# Patient Record
Sex: Male | Born: 1955
Health system: Southern US, Community
[De-identification: ages and names within clinical notes are randomized; demographics above are authoritative.]

## PROBLEM LIST (undated history)

## (undated) DIAGNOSIS — M1990 Unspecified osteoarthritis, unspecified site: Secondary | ICD-10-CM

## (undated) DIAGNOSIS — R7989 Other specified abnormal findings of blood chemistry: Secondary | ICD-10-CM

## (undated) DIAGNOSIS — R7303 Prediabetes: Secondary | ICD-10-CM

## (undated) DIAGNOSIS — G4733 Obstructive sleep apnea (adult) (pediatric): Secondary | ICD-10-CM

## (undated) DIAGNOSIS — G5600 Carpal tunnel syndrome, unspecified upper limb: Secondary | ICD-10-CM

## (undated) DIAGNOSIS — I209 Angina pectoris, unspecified: Secondary | ICD-10-CM

## (undated) DIAGNOSIS — B351 Tinea unguium: Secondary | ICD-10-CM

## (undated) DIAGNOSIS — G4730 Sleep apnea, unspecified: Secondary | ICD-10-CM

## (undated) HISTORY — DX: Prediabetes: R73.03

## (undated) HISTORY — DX: Carpal tunnel syndrome, unspecified upper limb: G56.00

## (undated) HISTORY — PX: ROTATOR CUFF REPAIR: SHX139

## (undated) HISTORY — DX: Obstructive sleep apnea (adult) (pediatric): G47.33

## (undated) HISTORY — PX: OTHER SURGICAL HISTORY: SHX169

## (undated) HISTORY — DX: Other specified abnormal findings of blood chemistry: R79.89

## (undated) HISTORY — PX: NASAL SEPTUM SURGERY: SHX37

## (undated) HISTORY — DX: Tinea unguium: B35.1

---

## 2000-01-30 ENCOUNTER — Encounter: Payer: Self-pay | Admitting: Emergency Medicine

## 2000-01-30 ENCOUNTER — Emergency Department (HOSPITAL_COMMUNITY): Admission: EM | Admit: 2000-01-30 | Discharge: 2000-01-30 | Payer: Self-pay | Admitting: Emergency Medicine

## 2011-01-17 ENCOUNTER — Encounter: Payer: Self-pay | Admitting: Orthopaedic Surgery

## 2015-02-01 ENCOUNTER — Ambulatory Visit: Payer: Self-pay | Admitting: Orthopedic Surgery

## 2015-02-01 NOTE — Progress Notes (Signed)
Preoperative surgical orders have been place into the Epic hospital system for Bill [name] on 02/01/2015, 10:51 AM  by Patrica DuelPERKINS, Constancia Geeting for surgery on 02-14-2015.  Preop Total Hip - Anterior Approach orders including Experel Injecion, IV Tylenol, and IV Decadron as long as there are no contraindications to the above medications. [name], PA-C

## 2015-02-08 ENCOUNTER — Encounter (HOSPITAL_COMMUNITY)
Admission: RE | Admit: 2015-02-08 | Discharge: 2015-02-08 | Disposition: A | Discharge status: Home / Self Care | Payer: BLUE CROSS/BLUE SHIELD | Source: Ambulatory Visit | Attending: Orthopedic Surgery | Admitting: Orthopedic Surgery

## 2015-02-08 ENCOUNTER — Encounter (HOSPITAL_COMMUNITY): Payer: Self-pay

## 2015-02-08 DIAGNOSIS — Z01818 Encounter for other preprocedural examination: Secondary | ICD-10-CM | POA: Insufficient documentation

## 2015-02-08 DIAGNOSIS — M1611 Unilateral primary osteoarthritis, right hip: Secondary | ICD-10-CM | POA: Insufficient documentation

## 2015-02-08 HISTORY — DX: Sleep apnea, unspecified: G47.30

## 2015-02-08 HISTORY — DX: Angina pectoris, unspecified: I20.9

## 2015-02-08 HISTORY — DX: Unspecified osteoarthritis, unspecified site: M19.90

## 2015-02-08 LAB — COMPREHENSIVE METABOLIC PANEL
ALBUMIN: 4.6 g/dL (ref 3.5–5.2)
ALK PHOS: 67 U/L (ref 39–117)
ALT: 15 U/L (ref 0–53)
AST: 23 U/L (ref 0–37)
Anion gap: 8 (ref 5–15)
BUN: 20 mg/dL (ref 6–23)
CO2: 30 mmol/L (ref 19–32)
Calcium: 9.7 mg/dL (ref 8.4–10.5)
Chloride: 98 mmol/L (ref 96–112)
Creatinine, Ser: 0.73 mg/dL (ref 0.50–1.35)
GFR calc Af Amer: >90 mL/min (ref >90)
GFR calc non Af Amer: >90 mL/min (ref >90)
Glucose, Bld: 88 mg/dL (ref 70–99)
POTASSIUM: 3.9 mmol/L (ref 3.5–5.1)
SODIUM: 136 mmol/L (ref 135–145)
Total Bilirubin: 0.9 mg/dL (ref 0.3–1.2)
Total Protein: 7.8 g/dL (ref 6.0–8.3)

## 2015-02-08 LAB — SURGICAL PCR SCREEN
MRSA, PCR: NEGATIVE
Staphylococcus aureus: NEGATIVE

## 2015-02-08 LAB — URINALYSIS, ROUTINE W REFLEX MICROSCOPIC
BILIRUBIN URINE: NEGATIVE
Glucose, UA: NEGATIVE mg/dL
HGB URINE DIPSTICK: NEGATIVE
Ketones, ur: NEGATIVE mg/dL
Leukocytes, UA: NEGATIVE
Nitrite: NEGATIVE
PH: 6 (ref 5.0–8.0)
PROTEIN: NEGATIVE mg/dL
Specific Gravity, Urine: 1.016 (ref 1.005–1.030)
Urobilinogen, UA: 0.2 mg/dL (ref 0.0–1.0)

## 2015-02-08 LAB — CBC
HEMATOCRIT: 50.4 % (ref 39.0–52.0)
HEMOGLOBIN: 17.1 g/dL — AB (ref 13.0–17.0)
MCH: 31.5 pg (ref 26.0–34.0)
MCHC: 33.9 g/dL (ref 30.0–36.0)
MCV: 92.8 fL (ref 78.0–100.0)
Platelets: 304 10*3/uL (ref 150–400)
RBC: 5.43 MIL/uL (ref 4.22–5.81)
RDW: 13.1 % (ref 11.5–15.5)
WBC: 7.5 10*3/uL (ref 4.0–10.5)

## 2015-02-08 LAB — PROTIME-INR
INR: 0.96 (ref 0.00–1.49)
Prothrombin Time: 12.8 seconds (ref 11.6–15.2)

## 2015-02-08 LAB — APTT: APTT: 33 s (ref 24–37)

## 2015-02-08 NOTE — Patient Instructions (Addendum)
20 Brennan BaileyJerry W Sandell  02/08/2015   Your procedure is scheduled on:      Thursday February 18,2016   [facility] and follow signs to  Short Stay Center arrive at 11:00 AM.   Call this number if you have problems the morning of surgery [phone] or Presurgical Testing [phone].   Remember:  Do not eat food After Midnight but may take clear liquids till 7:00 am day of surgery then nothing by mouth.   For Living Will and/or Health Care Power Attorney Forms: please provide copy for your medical record, may bring AM of surgery (forms should be already notarized-we do not provide this service).    For Cpap use: bring mask and tubing only.     Take these medicines the morning of surgery with A SIP OF WATER: NONE                               You may not have any metal on your body including hair pins and piercings  Do not wear jewelry,cologne, lotions, powders, or deodorant.  Men may shave face and neck.               Do not bring valuables to the hospital. [facility] IS NOT RESPONSIBLE FOR VALUABLES.  Contacts, dentures or bridgework may not be worn into surgery.  Leave suitcase in the car. After surgery it may be brought to your room.  For patients admitted to the hospital, checkout time is 11:00 AM the day of discharge.    Special Instructions: review fact sheets for MRSA information, Blood Transfusion fact sheet, Incentive Spirometry.  ________________________________________________________________________  [facility] Before surgery, you can play an important role.  Because skin is not sterile, your skin needs to be as free of germs as possible.  You can reduce the number of germs on your skin by washing with CHG (chlorahexidine gluconate) soap before surgery.  CHG is an antiseptic cleaner which kills germs and bonds with the skin to continue killing germs even after washing. Please DO NOT use if you have an allergy to CHG or  antibacterial soaps.  If your skin becomes reddened/irritated stop using the CHG and inform your nurse when you arrive at Short Stay. Do not shave (including legs and underarms) for at least 48 hours prior to the first CHG shower.  You may shave your face/neck. Please follow these instructions carefully:  1.  Shower with CHG Soap the night before surgery and the  morning of Surgery.  2.  If you choose to wash your hair, wash your hair first as usual with your  normal  shampoo.  3.  After you shampoo, rinse your hair and body thoroughly to remove the  shampoo.                           4.  Use CHG as you would any other liquid soap.  You can apply chg directly  to the skin and wash                       Gently with a scrungie or clean washcloth.  5.  Apply the CHG Soap to your body ONLY FROM THE NECK DOWN.   Do not use on face/ open  Wound or open sores. Avoid contact with eyes, ears mouth and genitals (private parts).                       Wash face,  Genitals (private parts) with your normal soap.             6.  Wash thoroughly, paying special attention to the area where your surgery  will be performed.  7.  Thoroughly rinse your body with warm water from the neck down.  8.  DO NOT shower/wash with your normal soap after using and rinsing off  the CHG Soap.                9.  Pat yourself dry with a clean towel.            10.  Wear clean pajamas.            11.  Place clean sheets on your bed the night of your first shower and do not  sleep with pets. Day of Surgery : Do not apply any lotions/deodorants the morning of surgery.  Please wear clean clothes to the hospital/surgery center.  FAILURE TO FOLLOW THESE INSTRUCTIONS MAY RESULT IN THE CANCELLATION OF YOUR SURGERY PATIENT SIGNATURE_________________________________  NURSE SIGNATURE__________________________________  ________________________________________________________________________    CLEAR LIQUID  DIET   Foods Allowed                                                                     Foods Excluded  Coffee and tea, regular and decaf                             liquids that you cannot  Plain Jell-O in any flavor                                             see through such as: Fruit ices (not with fruit pulp)                                     milk, soups, orange juice  Iced Popsicles                                    All solid food Carbonated beverages, regular and diet                                    Cranberry, grape and apple juices Sports drinks like Gatorade Lightly seasoned clear broth or consume(fat free) Sugar, honey syrup  Sample Menu Breakfast                                Lunch  Supper Cranberry juice                    Beef broth                            Chicken broth Jell-O                                     Grape juice                           Apple juice Coffee or tea                        Jell-O                                      Popsicle                                                Coffee or tea                        Coffee or tea  _____________________________________________________________________    Incentive Spirometer  An incentive spirometer is a tool that can help keep your lungs clear and active. This tool measures how well you are filling your lungs with each breath. Taking long deep breaths may help reverse or decrease the chance of developing breathing (pulmonary) problems (especially infection) following:  A long period of time when you are unable to move or be active. BEFORE THE PROCEDURE   If the spirometer includes an indicator to show your best effort, your nurse or respiratory therapist will set it to a desired goal.  If possible, sit up straight or lean slightly forward. Try not to slouch.  Hold the incentive spirometer in an upright position. INSTRUCTIONS FOR USE   Sit on the edge of  your bed if possible, or sit up as far as you can in bed or on a chair.  Hold the incentive spirometer in an upright position.  Breathe out normally.  Place the mouthpiece in your mouth and seal your lips tightly around it.  Breathe in slowly and as deeply as possible, raising the piston or the ball toward the top of the column.  Hold your breath for 3-5 seconds or for as long as possible. Allow the piston or ball to fall to the bottom of the column.  Remove the mouthpiece from your mouth and breathe out normally.  Rest for a few seconds and repeat Steps 1 through 7 at least 10 times every 1-2 hours when you are awake. Take your time and take a few normal breaths between deep breaths.  The spirometer may include an indicator to show your best effort. Use the indicator as a goal to work toward during each repetition.  After each set of 10 deep breaths, practice coughing to be sure your lungs are clear. If you have an incision (the cut made at the time of surgery), support your incision when coughing by placing a pillow or rolled up towels firmly against  it. Once you are able to get out of bed, walk around indoors and cough well. You may stop using the incentive spirometer when instructed by your caregiver.  RISKS AND COMPLICATIONS  Take your time so you do not get dizzy or light-headed.  If you are in pain, you may need to take or ask for pain medication before doing incentive spirometry. It is harder to take a deep breath if you are having pain. AFTER USE  Rest and breathe slowly and easily.  It can be helpful to keep track of a log of your progress. Your caregiver can provide you with a simple table to help with this. If you are using the spirometer at home, follow these instructions: [place] IF:   You are having difficultly using the spirometer.  You have trouble using the spirometer as often as instructed.  Your pain medication is not giving enough relief while using  the spirometer.  You develop fever of 100.5 F (38.1 C) or higher. SEEK IMMEDIATE MEDICAL CARE IF:   You cough up bloody sputum that had not been present before.  You develop fever of 102 F (38.9 C) or greater.  You develop worsening pain at or near the incision site. MAKE SURE YOU:   Understand these instructions.  Will watch your condition.  Will get help right away if you are not doing well or get worse. Document Released: 04/26/2007 Document Revised: 03/07/2012 Document Reviewed: 06/27/2007 Exit[employer] Patient Information 2014 Exit[employer], [place].   ________________________________________________________________________  WHAT IS A BLOOD TRANSFUSION? Blood Transfusion Information  A transfusion is the replacement of blood or some of its parts. Blood is made up of multiple cells which provide different functions.  Red blood cells carry oxygen and are used for blood loss replacement.  White blood cells fight against infection.  Platelets control bleeding.  Plasma helps clot blood.  Other blood products are available for specialized needs, such as hemophilia or other clotting disorders. BEFORE THE TRANSFUSION  Who gives blood for transfusions?   Healthy volunteers who are fully evaluated to make sure their blood is safe. This is blood bank blood. Transfusion therapy is the safest it has ever been in the practice of medicine. Before blood is taken from a donor, a complete history is taken to make sure that person has no history of diseases nor engages in risky social behavior (examples are intravenous drug use or sexual activity with multiple partners). The donor's travel history is screened to minimize risk of transmitting infections, such as malaria. The donated blood is tested for signs of infectious diseases, such as HIV and hepatitis. The blood is then tested to be sure it is compatible with you in order to minimize the chance of a transfusion reaction. If you or a relative  donates blood, this is often done in anticipation of surgery and is not appropriate for emergency situations. It takes many days to process the donated blood. RISKS AND COMPLICATIONS Although transfusion therapy is very safe and saves many lives, the main dangers of transfusion include:   Getting an infectious disease.  Developing a transfusion reaction. This is an allergic reaction to something in the blood you were given. Every precaution is taken to prevent this. The decision to have a blood transfusion has been considered carefully by your caregiver before blood is given. Blood is not given unless the benefits outweigh the risks. AFTER THE TRANSFUSION  Right after receiving a blood transfusion, you will usually feel much better and more  energetic. This is especially true if your red blood cells have gotten low (anemic). The transfusion raises the level of the red blood cells which carry oxygen, and this usually causes an energy increase.  The nurse administering the transfusion will monitor you carefully for complications. HOME CARE INSTRUCTIONS  No special instructions are needed after a transfusion. You may find your energy is better. Speak with your caregiver about any limitations on activity for underlying diseases you may have. SEEK MEDICAL CARE IF:   Your condition is not improving after your transfusion.  You develop redness or irritation at the intravenous (IV) site. SEEK IMMEDIATE MEDICAL CARE IF:  Any of the following symptoms occur over the next 12 hours:  Shaking chills.  You have a temperature by mouth above 102 F (38.9 C), not controlled by medicine.  Chest, back, or muscle pain.  People around you feel you are not acting correctly or are confused.  Shortness of breath or difficulty breathing.  Dizziness and fainting.  You get a rash or develop hives.  You have a decrease in urine output.  Your urine turns a dark color or changes to pink, red, or brown. Any  of the following symptoms occur over the next 10 days:  You have a temperature by mouth above 102 F (38.9 C), not controlled by medicine.  Shortness of breath.  Weakness after normal activity.  The white part of the eye turns yellow (jaundice).  You have a decrease in the amount of urine or are urinating less often.  Your urine turns a dark color or changes to pink, red, or brown. Document Released: 12/11/2000 Document Revised: 03/07/2012 Document Reviewed: 07/30/2008 [facility] Patient Information 2014 [place], [place].  _______________________________________________________________________

## 2015-02-08 NOTE — Progress Notes (Signed)
Clearance note per chart per Dr Azucena CecilSwayne 12/06/2014

## 2015-02-12 NOTE — H&P (Signed)
TOTAL HIP ADMISSION H&P  Patient is admitted for right total hip arthroplasty.  Subjective:  Chief Complaint: right hip pain  HPI: Bill [name], 59 y.o. male, has a history of pain and functional disability in the right hip(s) due to arthritis and patient has failed non-surgical conservative treatments for greater than 12 weeks to include NSAID's and/or analgesics, corticosteriod injections and activity modification.  Onset of symptoms was gradual starting 2 years ago with gradually worsening course since that time.The patient noted no past surgery on the right hip(s).  Patient currently rates pain in the right hip at 8 out of 10 with activity. Patient has night pain, worsening of pain with activity and weight bearing, pain that interfers with activities of daily living, pain with passive range of motion and crepitus. Patient has evidence of periarticular osteophytes and joint space narrowing by imaging studies. This condition presents safety issues increasing the risk of falls.  There is no current active infection.   Past Medical History  Diagnosis Date  . Sleep apnea     uses CPAP  . Arthritis   . Anginal pain     hx of noted ER visit in 2001 pt had CXR preformed 01/30/2000 discussed CP issues pt states never had to followup with cardiologist denies further issues     Past Surgical History  Procedure Laterality Date  . Broken left foot       1988 secondary to fall had to have surgical intervention has hardware  . Nasal septum surgery    . Colonscopy         Current outpatient prescriptions:  .  naproxen sodium (ANAPROX) 220 MG tablet, Take 220 mg by mouth 2 (two) times daily as needed (for pain)., Disp: , Rfl:  .  testosterone cypionate (DEPOTESTOTERONE CYPIONATE) 100 MG/ML injection, Inject 100 mg into the muscle every 7 (seven) days. For IM use only, Disp: , Rfl:   No Known Allergies  History  Substance Use Topics  . Smoking status: Never Smoker   . Smokeless tobacco: Never Used   . Alcohol Use: No    Review of Systems  Constitutional: Negative.   HENT: Positive for tinnitus. Negative for congestion, ear discharge, ear pain, hearing loss, nosebleeds and sore throat.   Eyes: Negative.   Respiratory: Negative.  Negative for stridor.   Cardiovascular: Negative.   Gastrointestinal: Negative.   Genitourinary: Negative.   Musculoskeletal: Positive for back pain and joint pain. Negative for myalgias, falls and neck pain.       Right hip pain  Skin: Negative.   Neurological: Negative.  Negative for headaches.  Endo/Heme/Allergies: Negative.   Psychiatric/Behavioral: Negative.     Objective:  Physical Exam  Constitutional: He is oriented to person, place, and time. He appears well-developed and well-nourished. No distress.  HENT:  Head: Normocephalic and atraumatic.  Right Ear: External ear normal.  Left Ear: External ear normal.  Nose: Nose normal.  Mouth/Throat: Oropharynx is clear and moist.  Eyes: Conjunctivae and EOM are normal.  Neck: Normal range of motion. Neck supple.  Cardiovascular: Normal rate, regular rhythm, normal heart sounds and intact distal pulses.   No murmur heard. Respiratory: Effort normal and breath sounds normal. No respiratory distress. He has no wheezes.  GI: Soft. Bowel sounds are normal. He exhibits no distension. There is no tenderness.  Musculoskeletal:       Right hip: He exhibits decreased range of motion and crepitus.       Left hip: He exhibits decreased  range of motion and crepitus.       Right knee: He exhibits decreased range of motion. He exhibits no swelling, no effusion and no erythema. Tenderness found. Medial joint line and lateral joint line tenderness noted.       Left knee: Normal.       Right lower leg: He exhibits no tenderness and no swelling.       Left lower leg: He exhibits no tenderness and no swelling.  The right hip flexes to about 110, rotation in 20, out 30 and abduction 30 with slight discomfort. The  left hip has the same range of motion, also with slight discomfort. No trochanteric tenderness on either side. Right knee shows a slight valgus. Range is about 5-120. Moderate crepitus on range of motion. Tenderness lateral greater than medial with no instability noted.  Neurological: He is alert and oriented to person, place, and time. He has normal strength and normal reflexes. No sensory deficit.  Skin: No rash noted. He is not diaphoretic. No erythema.  Psychiatric: He has a normal mood and affect. His behavior is normal.    Vitals  Weight: 193.6 lb Height: 72.5in Body Surface Area: 2.11 m Body Mass Index: 25.9 kg/m  Pulse: 76 (Regular)  BP: 138/84 (Sitting, Left Arm, Standard)  Imaging Review Plain radiographs demonstrate severe degenerative joint disease of the right hip(s). The bone quality appears to be good for age and reported activity level.  Assessment/Plan:  End stage arthritis, right hip(s)  The patient history, physical examination, clinical judgement of the provider and imaging studies are consistent with end stage degenerative joint disease of the right hip(s) and total hip arthroplasty is deemed medically necessary. The treatment options including medical management, injection therapy, arthroscopy and arthroplasty were discussed at length. The risks and benefits of total hip arthroplasty were presented and reviewed. The risks due to aseptic loosening, infection, stiffness, dislocation/subluxation,  thromboembolic complications and other imponderables were discussed.  The patient acknowledged the explanation, agreed to proceed with the plan and consent was signed. Patient is being admitted for inpatient treatment for surgery, pain control, PT, OT, prophylactic antibiotics, VTE prophylaxis, progressive ambulation and ADL's and discharge planning.The patient is planning to be discharged to skilled nursing facility [facility])   TXA IV Needs walker and cane  PCP:  Dr. Tally Joe  Dimitri Ped, PA-C

## 2015-02-14 ENCOUNTER — Inpatient Hospital Stay (HOSPITAL_COMMUNITY): Payer: BLUE CROSS/BLUE SHIELD | Admitting: Anesthesiology

## 2015-02-14 ENCOUNTER — Encounter (HOSPITAL_COMMUNITY)
Admission: RE | Disposition: A | Discharge status: Skilled Nursing Facility | Payer: Self-pay | Source: Ambulatory Visit | Attending: Orthopedic Surgery

## 2015-02-14 ENCOUNTER — Encounter (HOSPITAL_COMMUNITY): Payer: Self-pay | Admitting: *Deleted

## 2015-02-14 ENCOUNTER — Inpatient Hospital Stay (HOSPITAL_COMMUNITY): Payer: BLUE CROSS/BLUE SHIELD

## 2015-02-14 ENCOUNTER — Inpatient Hospital Stay (HOSPITAL_COMMUNITY)
Admission: RE | Admit: 2015-02-14 | Discharge: 2015-02-16 | DRG: 470 | Disposition: A | Discharge status: Skilled Nursing Facility | Payer: BLUE CROSS/BLUE SHIELD | Source: Ambulatory Visit | Attending: Orthopedic Surgery | Admitting: Orthopedic Surgery

## 2015-02-14 DIAGNOSIS — Z79899 Other long term (current) drug therapy: Secondary | ICD-10-CM

## 2015-02-14 DIAGNOSIS — Z96649 Presence of unspecified artificial hip joint: Secondary | ICD-10-CM

## 2015-02-14 DIAGNOSIS — M1611 Unilateral primary osteoarthritis, right hip: Principal | ICD-10-CM | POA: Diagnosis present

## 2015-02-14 DIAGNOSIS — Z01812 Encounter for preprocedural laboratory examination: Secondary | ICD-10-CM | POA: Diagnosis not present

## 2015-02-14 DIAGNOSIS — G4730 Sleep apnea, unspecified: Secondary | ICD-10-CM | POA: Diagnosis present

## 2015-02-14 DIAGNOSIS — M169 Osteoarthritis of hip, unspecified: Secondary | ICD-10-CM | POA: Diagnosis present

## 2015-02-14 DIAGNOSIS — M25551 Pain in right hip: Secondary | ICD-10-CM | POA: Diagnosis present

## 2015-02-14 HISTORY — PX: TOTAL HIP ARTHROPLASTY: SHX124

## 2015-02-14 LAB — ABO/RH: ABO/RH(D): A NEG

## 2015-02-14 LAB — TYPE AND SCREEN
ABO/RH(D): A NEG
ANTIBODY SCREEN: NEGATIVE

## 2015-02-14 SURGERY — ARTHROPLASTY, HIP, TOTAL, ANTERIOR APPROACH
Anesthesia: Spinal | Site: Hip | Laterality: Right

## 2015-02-14 MED ORDER — LIDOCAINE HCL (CARDIAC) 20 MG/ML IV SOLN
INTRAVENOUS | Status: DC | PRN
  Administered 2015-02-14: 100 mg via INTRAVENOUS

## 2015-02-14 MED ORDER — ONDANSETRON HCL 4 MG/2ML IJ SOLN: 4.0000 mg | Freq: Four times a day (QID) | INTRAMUSCULAR | Status: DC | PRN

## 2015-02-14 MED ORDER — PHENYLEPHRINE HCL 10 MG/ML IJ SOLN
INTRAMUSCULAR | Status: AC
  Filled 2015-02-14: qty 1

## 2015-02-14 MED ORDER — RIVAROXABAN 10 MG PO TABS
10.0000 mg | ORAL_TABLET | Freq: Every day | ORAL | Status: DC
  Administered 2015-02-15 – 2015-02-16 (×2): 10 mg via ORAL
  Filled 2015-02-14 (×3): qty 1

## 2015-02-14 MED ORDER — ACETAMINOPHEN 650 MG RE SUPP: 650.0000 mg | Freq: Four times a day (QID) | RECTAL | Status: DC | PRN

## 2015-02-14 MED ORDER — TRAMADOL HCL 50 MG PO TABS: 50.0000 mg | ORAL_TABLET | Freq: Four times a day (QID) | ORAL | Status: DC | PRN

## 2015-02-14 MED ORDER — MENTHOL 3 MG MT LOZG
1.0000 | LOZENGE | OROMUCOSAL | Status: DC | PRN
  Filled 2015-02-14: qty 9

## 2015-02-14 MED ORDER — CEFAZOLIN SODIUM-DEXTROSE 2-3 GM-% IV SOLR
2.0000 g | INTRAVENOUS | Status: AC
  Administered 2015-02-14: 2 g via INTRAVENOUS

## 2015-02-14 MED ORDER — SODIUM CHLORIDE 0.9 % IV SOLN: INTRAVENOUS | Status: DC

## 2015-02-14 MED ORDER — BISACODYL 10 MG RE SUPP: 10.0000 mg | Freq: Every day | RECTAL | Status: DC | PRN

## 2015-02-14 MED ORDER — PROPOFOL INFUSION 10 MG/ML OPTIME
INTRAVENOUS | Status: DC | PRN
  Administered 2015-02-14: 140 ug/kg/min via INTRAVENOUS

## 2015-02-14 MED ORDER — BUPIVACAINE HCL (PF) 0.5 % IJ SOLN
INTRAMUSCULAR | Status: AC
  Filled 2015-02-14: qty 30

## 2015-02-14 MED ORDER — PHENOL 1.4 % MT LIQD
1.0000 | OROMUCOSAL | Status: DC | PRN
Start: 2015-02-14 — End: 2015-02-16
  Filled 2015-02-14: qty 177

## 2015-02-14 MED ORDER — METOCLOPRAMIDE HCL 10 MG PO TABS
5.0000 mg | ORAL_TABLET | Freq: Three times a day (TID) | ORAL | Status: DC | PRN
Start: 2015-02-14 — End: 2015-02-16

## 2015-02-14 MED ORDER — PHENYLEPHRINE HCL 10 MG/ML IJ SOLN
INTRAMUSCULAR | Status: DC | PRN
  Administered 2015-02-14 (×3): 80 ug via INTRAVENOUS

## 2015-02-14 MED ORDER — METOCLOPRAMIDE HCL 5 MG/ML IJ SOLN: 5.0000 mg | Freq: Three times a day (TID) | INTRAMUSCULAR | Status: DC | PRN

## 2015-02-14 MED ORDER — OXYCODONE HCL 5 MG PO TABS
5.0000 mg | ORAL_TABLET | ORAL | Status: DC | PRN
  Administered 2015-02-14: 5 mg via ORAL
  Administered 2015-02-15 – 2015-02-16 (×9): 10 mg via ORAL
  Administered 2015-02-16: 5 mg via ORAL
  Filled 2015-02-14: qty 1
  Filled 2015-02-14 (×9): qty 2
  Filled 2015-02-14: qty 1
  Filled 2015-02-14: qty 2

## 2015-02-14 MED ORDER — MIDAZOLAM HCL 2 MG/2ML IJ SOLN
INTRAMUSCULAR | Status: AC
  Filled 2015-02-14: qty 2

## 2015-02-14 MED ORDER — TRANEXAMIC ACID 100 MG/ML IV SOLN
1000.0000 mg | INTRAVENOUS | Status: AC
  Administered 2015-02-14: 1000 mg via INTRAVENOUS
  Filled 2015-02-14: qty 10

## 2015-02-14 MED ORDER — PROPOFOL 10 MG/ML IV BOLUS
INTRAVENOUS | Status: AC
  Filled 2015-02-14: qty 20

## 2015-02-14 MED ORDER — DEXAMETHASONE SODIUM PHOSPHATE 10 MG/ML IJ SOLN
10.0000 mg | Freq: Once | INTRAMUSCULAR | Status: AC
  Administered 2015-02-15: 10 mg via INTRAVENOUS
  Filled 2015-02-14: qty 1

## 2015-02-14 MED ORDER — ACETAMINOPHEN 325 MG PO TABS: 650.0000 mg | ORAL_TABLET | Freq: Four times a day (QID) | ORAL | Status: DC | PRN

## 2015-02-14 MED ORDER — DIPHENHYDRAMINE HCL 12.5 MG/5ML PO ELIX: 12.5000 mg | ORAL_SOLUTION | ORAL | Status: DC | PRN

## 2015-02-14 MED ORDER — MORPHINE SULFATE 2 MG/ML IJ SOLN: 1.0000 mg | INTRAMUSCULAR | Status: DC | PRN

## 2015-02-14 MED ORDER — CEFAZOLIN SODIUM-DEXTROSE 2-3 GM-% IV SOLR
2.0000 g | Freq: Four times a day (QID) | INTRAVENOUS | Status: AC
  Administered 2015-02-14 – 2015-02-15 (×2): 2 g via INTRAVENOUS
  Filled 2015-02-14 (×2): qty 50

## 2015-02-14 MED ORDER — LACTATED RINGERS IV SOLN
INTRAVENOUS | Status: DC
  Administered 2015-02-14: 1000 mL via INTRAVENOUS
  Administered 2015-02-14: 14:00:00 via INTRAVENOUS

## 2015-02-14 MED ORDER — KCL IN DEXTROSE-NACL 20-5-0.9 MEQ/L-%-% IV SOLN
INTRAVENOUS | Status: DC
  Administered 2015-02-14: 75 mL/h via INTRAVENOUS
  Filled 2015-02-14 (×4): qty 1000

## 2015-02-14 MED ORDER — FENTANYL CITRATE 0.05 MG/ML IJ SOLN
INTRAMUSCULAR | Status: DC | PRN
  Administered 2015-02-14 (×2): 50 ug via INTRAVENOUS

## 2015-02-14 MED ORDER — CHLORHEXIDINE GLUCONATE 4 % EX LIQD: 60.0000 mL | Freq: Once | CUTANEOUS | Status: DC

## 2015-02-14 MED ORDER — SODIUM CHLORIDE 0.9 % IJ SOLN
INTRAMUSCULAR | Status: AC
  Filled 2015-02-14: qty 50

## 2015-02-14 MED ORDER — KETOROLAC TROMETHAMINE 15 MG/ML IJ SOLN: 7.5000 mg | Freq: Four times a day (QID) | INTRAMUSCULAR | Status: AC | PRN

## 2015-02-14 MED ORDER — SODIUM CHLORIDE 0.9 % IV SOLN
10.0000 mg | INTRAVENOUS | Status: DC | PRN
  Administered 2015-02-14: 25 ug/min via INTRAVENOUS

## 2015-02-14 MED ORDER — ONDANSETRON HCL 4 MG PO TABS: 4.0000 mg | ORAL_TABLET | Freq: Four times a day (QID) | ORAL | Status: DC | PRN

## 2015-02-14 MED ORDER — BUPIVACAINE LIPOSOME 1.3 % IJ SUSP
20.0000 mL | Freq: Once | INTRAMUSCULAR | Status: DC
  Filled 2015-02-14: qty 20

## 2015-02-14 MED ORDER — ACETAMINOPHEN 500 MG PO TABS
1000.0000 mg | ORAL_TABLET | Freq: Four times a day (QID) | ORAL | Status: AC
  Administered 2015-02-14 – 2015-02-15 (×4): 1000 mg via ORAL
  Filled 2015-02-14 (×4): qty 2

## 2015-02-14 MED ORDER — HYDROMORPHONE HCL 1 MG/ML IJ SOLN: 0.2500 mg | INTRAMUSCULAR | Status: DC | PRN

## 2015-02-14 MED ORDER — DEXAMETHASONE SODIUM PHOSPHATE 10 MG/ML IJ SOLN
INTRAMUSCULAR | Status: AC
  Filled 2015-02-14: qty 1

## 2015-02-14 MED ORDER — BUPIVACAINE HCL (PF) 0.25 % IJ SOLN
INTRAMUSCULAR | Status: AC
  Filled 2015-02-14: qty 30

## 2015-02-14 MED ORDER — FLEET ENEMA 7-19 GM/118ML RE ENEM: 1.0000 | ENEMA | Freq: Once | RECTAL | Status: AC | PRN

## 2015-02-14 MED ORDER — ACETAMINOPHEN 10 MG/ML IV SOLN
1000.0000 mg | Freq: Once | INTRAVENOUS | Status: AC
  Administered 2015-02-14: 1000 mg via INTRAVENOUS
  Filled 2015-02-14: qty 100

## 2015-02-14 MED ORDER — METHOCARBAMOL 1000 MG/10ML IJ SOLN
500.0000 mg | Freq: Four times a day (QID) | INTRAVENOUS | Status: DC | PRN
  Filled 2015-02-14: qty 5

## 2015-02-14 MED ORDER — DEXAMETHASONE SODIUM PHOSPHATE 10 MG/ML IJ SOLN
10.0000 mg | Freq: Once | INTRAMUSCULAR | Status: AC
  Administered 2015-02-14: 10 mg via INTRAVENOUS

## 2015-02-14 MED ORDER — BUPIVACAINE LIPOSOME 1.3 % IJ SUSP
INTRAMUSCULAR | Status: DC | PRN
  Administered 2015-02-14: 20 mL

## 2015-02-14 MED ORDER — BUPIVACAINE HCL (PF) 0.25 % IJ SOLN
INTRAMUSCULAR | Status: DC | PRN
  Administered 2015-02-14: 20 mL

## 2015-02-14 MED ORDER — FENTANYL CITRATE 0.05 MG/ML IJ SOLN
INTRAMUSCULAR | Status: AC
  Filled 2015-02-14: qty 2

## 2015-02-14 MED ORDER — CEFAZOLIN SODIUM-DEXTROSE 2-3 GM-% IV SOLR
INTRAVENOUS | Status: AC
  Filled 2015-02-14: qty 50

## 2015-02-14 MED ORDER — LIDOCAINE HCL (CARDIAC) 20 MG/ML IV SOLN
INTRAVENOUS | Status: AC
  Filled 2015-02-14: qty 5

## 2015-02-14 MED ORDER — POLYETHYLENE GLYCOL 3350 17 G PO PACK: 17.0000 g | PACK | Freq: Every day | ORAL | Status: DC | PRN

## 2015-02-14 MED ORDER — DOCUSATE SODIUM 100 MG PO CAPS
100.0000 mg | ORAL_CAPSULE | Freq: Two times a day (BID) | ORAL | Status: DC
  Administered 2015-02-14 – 2015-02-16 (×4): 100 mg via ORAL

## 2015-02-14 MED ORDER — 0.9 % SODIUM CHLORIDE (POUR BTL) OPTIME
TOPICAL | Status: DC | PRN
  Administered 2015-02-14: 1000 mL

## 2015-02-14 MED ORDER — SODIUM CHLORIDE 0.9 % IJ SOLN
INTRAMUSCULAR | Status: DC | PRN
  Administered 2015-02-14: 30 mL

## 2015-02-14 MED ORDER — LACTATED RINGERS IV SOLN
INTRAVENOUS | Status: DC
Start: 2015-02-14 — End: 2015-02-14

## 2015-02-14 MED ORDER — MIDAZOLAM HCL 5 MG/5ML IJ SOLN
INTRAMUSCULAR | Status: DC | PRN
  Administered 2015-02-14: 2 mg via INTRAVENOUS

## 2015-02-14 MED ORDER — METHOCARBAMOL 500 MG PO TABS
500.0000 mg | ORAL_TABLET | Freq: Four times a day (QID) | ORAL | Status: DC | PRN
  Administered 2015-02-15: 500 mg via ORAL
  Filled 2015-02-14 (×2): qty 1

## 2015-02-14 SURGICAL SUPPLY — 47 items
BAG SPEC THK2 15X12 ZIP CLS (MISCELLANEOUS)
BAG ZIPLOCK 12X15 (MISCELLANEOUS) IMPLANT
BLADE EXTENDED COATED 6.5IN (ELECTRODE) ×2 IMPLANT
BLADE SAG 18X100X1.27 (BLADE) ×2 IMPLANT
CAPT HIP TOTAL 2 ×1 IMPLANT
COVER PERINEAL POST (MISCELLANEOUS) ×2 IMPLANT
DECANTER SPIKE VIAL GLASS SM (MISCELLANEOUS) ×2 IMPLANT
DRAPE C-ARM 42X120 X-RAY (DRAPES) ×2 IMPLANT
DRAPE STERI IOBAN 125X83 (DRAPES) ×2 IMPLANT
DRAPE U-SHAPE 47X51 STRL (DRAPES) ×6 IMPLANT
DRSG ADAPTIC 3X8 NADH LF (GAUZE/BANDAGES/DRESSINGS) ×2 IMPLANT
DRSG MEPILEX BORDER 4X4 (GAUZE/BANDAGES/DRESSINGS) ×2 IMPLANT
DRSG MEPILEX BORDER 4X8 (GAUZE/BANDAGES/DRESSINGS) ×2 IMPLANT
DURAPREP 26ML APPLICATOR (WOUND CARE) ×2 IMPLANT
ELECT REM PT RETURN 9FT ADLT (ELECTROSURGICAL) ×2
ELECTRODE REM PT RTRN 9FT ADLT (ELECTROSURGICAL) ×1 IMPLANT
EVACUATOR 1/8 PVC DRAIN (DRAIN) ×2 IMPLANT
FACESHIELD WRAPAROUND (MASK) ×8 IMPLANT
FACESHIELD WRAPAROUND OR TEAM (MASK) ×4 IMPLANT
GLOVE BIO SURGEON STRL SZ7.5 (GLOVE) ×2 IMPLANT
GLOVE BIO SURGEON STRL SZ8 (GLOVE) ×5 IMPLANT
GLOVE BIOGEL PI IND STRL 7.5 (GLOVE) IMPLANT
GLOVE BIOGEL PI IND STRL 8 (GLOVE) ×2 IMPLANT
GLOVE BIOGEL PI IND STRL 8.5 (GLOVE) IMPLANT
GLOVE BIOGEL PI INDICATOR 7.5 (GLOVE) ×1
GLOVE BIOGEL PI INDICATOR 8 (GLOVE) ×2
GLOVE BIOGEL PI INDICATOR 8.5 (GLOVE) ×1
GLOVE SURG SS PI 7.5 STRL IVOR (GLOVE) ×1 IMPLANT
GOWN BRE IMP PREV XXLGXLNG (GOWN DISPOSABLE) ×1 IMPLANT
GOWN STRL REUS W/TWL LRG LVL3 (GOWN DISPOSABLE) ×2 IMPLANT
GOWN STRL REUS W/TWL XL LVL3 (GOWN DISPOSABLE) ×3 IMPLANT
KIT BASIN OR (CUSTOM PROCEDURE TRAY) ×2 IMPLANT
NDL SAFETY ECLIPSE 18X1.5 (NEEDLE) ×2 IMPLANT
NEEDLE HYPO 18GX1.5 SHARP (NEEDLE) ×4
PACK TOTAL JOINT (CUSTOM PROCEDURE TRAY) ×2 IMPLANT
PEN SKIN MARKING BROAD (MISCELLANEOUS) ×2 IMPLANT
STRIP CLOSURE SKIN 1/2X4 (GAUZE/BANDAGES/DRESSINGS) ×2 IMPLANT
SUT ETHIBOND NAB CT1 #1 30IN (SUTURE) ×2 IMPLANT
SUT MNCRL AB 4-0 PS2 18 (SUTURE) ×2 IMPLANT
SUT VIC AB 2-0 CT1 27 (SUTURE) ×4
SUT VIC AB 2-0 CT1 TAPERPNT 27 (SUTURE) ×2 IMPLANT
SUT VLOC 180 0 24IN GS25 (SUTURE) ×2 IMPLANT
SYR 20CC LL (SYRINGE) ×2 IMPLANT
SYR 50ML LL SCALE MARK (SYRINGE) ×2 IMPLANT
TOWEL OR 17X26 10 PK STRL BLUE (TOWEL DISPOSABLE) ×2 IMPLANT
TOWEL OR NON WOVEN STRL DISP B (DISPOSABLE) ×1 IMPLANT
TRAY FOLEY CATH 16FRSI W/METER (SET/KITS/TRAYS/PACK) ×1 IMPLANT

## 2015-02-14 NOTE — Anesthesia Postprocedure Evaluation (Signed)
  Anesthesia Post-op Note  Patient: Bill [name]  Procedure(s) Performed: Procedure(s) (LRB): RIGHT TOTAL HIP ARTHROPLASTY ANTERIOR APPROACH (Right)  Patient Location: PACU  Anesthesia Type: Spinal  Level of Consciousness: awake and alert   Airway and Oxygen Therapy: Patient Spontanous Breathing  Post-op Pain: mild  Post-op Assessment: Post-op Vital signs reviewed, Patient's Cardiovascular Status Stable, Respiratory Function Stable, Patent Airway and No signs of Nausea or vomiting  Last Vitals:  Filed Vitals:   02/14/15 1630  BP: 108/67  Pulse: 63  Temp:   Resp: 12    Post-op Vital Signs: stable   Complications: No apparent anesthesia complications

## 2015-02-14 NOTE — Anesthesia Procedure Notes (Signed)
Spinal Patient location during procedure: OR Start time: 02/14/2015 1:25 PM End time: 02/14/2015 1:30 PM Staffing Resident/CRNA: Virgia Land J Performed by: resident/CRNA  Preanesthetic Checklist Completed: patient identified, site marked, surgical consent, pre-op evaluation, timeout performed, IV checked, risks and benefits discussed and monitors and equipment checked Spinal Block Patient position: sitting Prep: Betadine Patient monitoring: heart rate, continuous pulse ox and blood pressure Location: L4-5 Injection technique: single-shot Needle Needle type: Sprotte  Additional Notes Expiration date of kit checked and confirmed. Sterile prep and drape, including hand hygiene, mask and sterile gloves. Patient was positioned and spine was prepped. Skin was anesthetized with lidocaine. Free flow of clear CSF prior to injecting local anesthesia into CSF. Spinal needle was aspirated freely following injection. Needle was carefully withdrawn. Patient tolerated procedure well, without complications.

## 2015-02-14 NOTE — Op Note (Signed)
OPERATIVE REPORT  PREOPERATIVE DIAGNOSIS: Osteoarthritis of the Right hip.   POSTOPERATIVE DIAGNOSIS: Osteoarthritis of the Right  hip.   PROCEDURE: Right total hip arthroplasty, anterior approach.   SURGEON: Ollen GrossFrank Khush Pasion, MD   ASSISTANT: Avel Peacerew Perkins, PA-C  ANESTHESIA:  Spinal  ESTIMATED BLOOD LOSS:-250 ml  DRAINS: Hemovac x1.   COMPLICATIONS: None   CONDITION: PACU - hemodynamically stable.   BRIEF CLINICAL NOTE: Brennan BaileyJerry W Tisby is a 59 y.o. male who has advanced end-  stage arthritis of his Right  hip with progressively worsening pain and  dysfunction.The patient has failed nonoperative management and presents for  total hip arthroplasty.   PROCEDURE IN DETAIL: After successful administration of spinal  anesthetic, the traction boots [facility] were placed on both  feet and the [facility], boots placed into the leg  holders. The Right hip was then isolated from the perineum with plastic  drapes and prepped and draped in the usual sterile fashion. ASIS and  greater trochanter were marked and a oblique incision was made, starting  at about 1 cm lateral and 2 cm distal to the ASIS and coursing towards  the anterior cortex of the femur. The skin was cut with a 10 blade  through subcutaneous tissue to the level of the fascia overlying the  tensor fascia lata muscle. The fascia was then incised in line with the  incision at the junction of the anterior third and posterior 2/3rd. The  muscle was teased off the fascia and then the interval between the TFL  and the rectus was developed. The Hohmann retractor was then placed at  the top of the femoral neck over the capsule. The vessels overlying the  capsule were cauterized and the fat on top of the capsule was removed.  A Hohmann retractor was then placed anterior underneath the rectus  femoris to give exposure to the entire anterior capsule. A T-shaped  capsulotomy was performed. The  edges were tagged and the femoral head  was identified.       Osteophytes are removed off the superior acetabulum.  The femoral neck was then cut in situ with an oscillating saw. Traction  was then applied to the left lower extremity utilizing the [facility]  traction. The femoral head was then removed. Retractors were placed  around the acetabulum and then circumferential removal of the labrum was  performed. Osteophytes were also removed. Reaming starts at 47 mm to  medialize and  Increased in 2 mm increments to 51 mm. We reamed in  approximately 40 degrees of abduction, 20 degrees anteversion. A 52 mm  pinnacle acetabular shell was then impacted in anatomic position under  fluoroscopic guidance with excellent purchase. We did not need to place  any additional dome screws. A 32mm neutral + 4 marathon liner was then  placed into the acetabular shell.       The femoral lift was then placed along the lateral aspect of the femur  just distal to the vastus ridge. The leg was  externally rotated and capsule  was stripped off the inferior aspect of the femoral neck down to the  level of the lesser trochanter, this was done with electrocautery. The femur was lifted after this was performed. The  leg was then placed and extended in adducted position to essentially delivering the femur. We also removed the capsule superiorly and the  piriformis from the piriformis fossa to  gain excellent exposure of the  proximal femur. Rongeur was used to remove some cancellous bone to get  into the lateral portion of the proximal femur for placement of the  initial starter reamer. The starter broaches was placed  the starter broach  and was shown to go down the center of the canal. Broaching  with the  Corail system was then performed starting at size 8, coursing  Up to size 12. A size 12 had excellent torsional and rotational  and axial stability. The trial standard offset neck was then placed  with a 32 + 1 trial  head. The hip was then reduced. We confirmed that  the stem was in the canal both on AP and lateral x-rays. It also has excellent sizing. The hip was reduced with outstanding stability through full extension, full external rotation,  and then flexion in adduction internal rotation. AP pelvis was taken  and the leg lengths were measured and found to be exactly equal. Hip  was then dislocated again and the femoral head and neck removed. The  femoral broach was removed. Size 12 Corail stem with a standard offset  neck was then impacted into the femur following native anteversion. Has  excellent purchase in the canal. Excellent torsional and rotational and  axial stability. It is confirmed to be in the canal on AP and lateral  fluoroscopic views. The 32 + 1 ceramic head was placed and the hip  reduced with outstanding stability. Again AP pelvis was taken and it  confirmed that the leg lengths were equal. The wound was then copiously  irrigated with saline solution and the capsule reattached and repaired  with Ethibond suture.  20 mL of Exparel mixed with 50 mL of saline then additional 20 ml of .25% Bupivicaine injected into the capsule and into the edge of the tensor fascia lata as well as subcutaneous tissue. The fascia overlying the tensor fascia lata was  then closed with a running #1 V-Loc. Subcu was closed with interrupted  2-0 Vicryl and subcuticular running 4-0 Monocryl. Incision was cleaned  and dried. Steri-Strips and a bulky sterile dressing applied. Hemovac  drain was hooked to suction and then he was awakened and transported to  recovery in stable condition.        Please note that a surgical assistant was a medical necessity for this procedure to perform it in a safe and expeditious manner. Assistant was necessary to provide appropriate retraction of vital neurovascular structures and to prevent femoral fracture and allow for anatomic placement of the prosthesis.  Ollen Gross, M.D.

## 2015-02-14 NOTE — Interval H&P Note (Signed)
History and Physical Interval Note:  02/14/2015 1:21 PM  Bill [name]  has presented today for surgery, with the diagnosis of OSTEOARTHRITIS RIGHT HIP  The various methods of treatment have been discussed with the patient and family. After consideration of risks, benefits and other options for treatment, the patient has consented to  Procedure(s): RIGHT TOTAL HIP ARTHROPLASTY ANTERIOR APPROACH (Right) as a surgical intervention .  The patient's history has been reviewed, patient examined, no change in status, stable for surgery.  I have reviewed the patient's chart and labs.  Questions were answered to the patient's satisfaction.     Loanne DrillingALUISIO,Abdulmalik Darco V

## 2015-02-14 NOTE — Anesthesia Preprocedure Evaluation (Addendum)
Anesthesia Evaluation  Patient identified by MRN, date of birth, ID band Patient awake    Reviewed: Allergy & Precautions, H&P , NPO status , Patient's Chart, lab work & pertinent test results  Airway Mallampati: II  TM Distance: >3 FB Neck ROM: full    Dental no notable dental hx. (+) Teeth Intact, Dental Advisory Given   Pulmonary neg pulmonary ROS, sleep apnea and Continuous Positive Airway Pressure Ventilation ,  breath sounds clear to auscultation  Pulmonary exam normal       Cardiovascular Exercise Tolerance: Good negative cardio ROS  Rhythm:regular Rate:Normal  History CP not worked up but no further issues   Neuro/Psych negative neurological ROS  negative psych ROS   GI/Hepatic negative GI ROS, Neg liver ROS,   Endo/Other  negative endocrine ROS  Renal/GU negative Renal ROS  negative genitourinary   Musculoskeletal   Abdominal   Peds  Hematology negative hematology ROS (+)   Anesthesia Other Findings   Reproductive/Obstetrics negative OB ROS                             Anesthesia Physical Anesthesia Plan  ASA: III  Anesthesia Plan: Spinal   Post-op Pain Management:    Induction:   Airway Management Planned: Simple Face Mask  Additional Equipment:   Intra-op Plan:   Post-operative Plan:   Informed Consent: I have reviewed the patients History and Physical, chart, labs and discussed the procedure including the risks, benefits and alternatives for the proposed anesthesia with the patient or authorized representative who has indicated his/her understanding and acceptance.   Dental Advisory Given  Plan Discussed with: CRNA and Surgeon  Anesthesia Plan Comments:        Anesthesia Quick Evaluation

## 2015-02-14 NOTE — Transfer of Care (Signed)
Immediate Anesthesia Transfer of Care Note  Patient: Bill BaileyJerry W Cragg  Procedure(s) Performed: Procedure(s) (LRB): RIGHT TOTAL HIP ARTHROPLASTY ANTERIOR APPROACH (Right)  Patient Location: PACU  Anesthesia Type: Spinal  Level of Consciousness: sedated, patient cooperative and responds to stimulation  Airway & Oxygen Therapy: Patient Spontanous Breathing and Patient connected to face mask oxgen  Post-op Assessment: Report given to PACU RN and Post -op Vital signs reviewed and stable  Post vital signs: Reviewed and stable  Complications: No apparent anesthesia complications

## 2015-02-15 ENCOUNTER — Encounter (HOSPITAL_COMMUNITY): Payer: Self-pay | Admitting: Orthopedic Surgery

## 2015-02-15 LAB — BASIC METABOLIC PANEL
ANION GAP: 7 (ref 5–15)
BUN: 12 mg/dL (ref 6–23)
CALCIUM: 8.8 mg/dL (ref 8.4–10.5)
CHLORIDE: 106 mmol/L (ref 96–112)
CO2: 25 mmol/L (ref 19–32)
CREATININE: 0.64 mg/dL (ref 0.50–1.35)
GFR calc Af Amer: >90 mL/min (ref >90)
Glucose, Bld: 151 mg/dL — ABNORMAL HIGH (ref 70–99)
Potassium: 4.1 mmol/L (ref 3.5–5.1)
Sodium: 138 mmol/L (ref 135–145)

## 2015-02-15 LAB — CBC
HEMATOCRIT: 46.2 % (ref 39.0–52.0)
Hemoglobin: 15.8 g/dL (ref 13.0–17.0)
MCH: 31.3 pg (ref 26.0–34.0)
MCHC: 34.2 g/dL (ref 30.0–36.0)
MCV: 91.5 fL (ref 78.0–100.0)
Platelets: 227 10*3/uL (ref 150–400)
RBC: 5.05 MIL/uL (ref 4.22–5.81)
RDW: 12.7 % (ref 11.5–15.5)
WBC: 14.9 10*3/uL — ABNORMAL HIGH (ref 4.0–10.5)

## 2015-02-15 MED ORDER — POLYETHYLENE GLYCOL 3350 17 G PO PACK: 17.0000 g | PACK | Freq: Every day | ORAL | Status: DC | PRN

## 2015-02-15 MED ORDER — RIVAROXABAN 10 MG PO TABS: 10.0000 mg | ORAL_TABLET | Freq: Every day | ORAL | Status: DC

## 2015-02-15 MED ORDER — METHOCARBAMOL 500 MG PO TABS: 500.0000 mg | ORAL_TABLET | Freq: Four times a day (QID) | ORAL | Status: DC | PRN

## 2015-02-15 MED ORDER — DOCUSATE SODIUM 100 MG PO CAPS
100.0000 mg | ORAL_CAPSULE | Freq: Two times a day (BID) | ORAL | Status: DC
Start: 2015-02-15 — End: 2017-07-19

## 2015-02-15 MED ORDER — BISACODYL 10 MG RE SUPP: 10.0000 mg | Freq: Every day | RECTAL | Status: DC | PRN

## 2015-02-15 MED ORDER — METOCLOPRAMIDE HCL 5 MG PO TABS: 5.0000 mg | ORAL_TABLET | Freq: Three times a day (TID) | ORAL | Status: DC | PRN

## 2015-02-15 MED ORDER — OXYCODONE HCL 5 MG PO TABS: 5.0000 mg | ORAL_TABLET | ORAL | Status: DC | PRN

## 2015-02-15 MED ORDER — ACETAMINOPHEN 325 MG PO TABS: 650.0000 mg | ORAL_TABLET | Freq: Four times a day (QID) | ORAL | Status: DC | PRN

## 2015-02-15 MED ORDER — ONDANSETRON HCL 4 MG PO TABS: 4.0000 mg | ORAL_TABLET | Freq: Four times a day (QID) | ORAL | Status: DC | PRN

## 2015-02-15 MED ORDER — TRAMADOL HCL 50 MG PO TABS: 50.0000 mg | ORAL_TABLET | Freq: Four times a day (QID) | ORAL | Status: DC | PRN

## 2015-02-15 NOTE — Progress Notes (Signed)
Physical Therapy Treatment Patient Details Name: Bill BaileyJerry W Kallio MRN: [id number] DOB: 07/17/1956 Today's Date: 02/15/2015    History of Present Illness Pt is s/p R direct anterior THA    PT Comments    Pt motivated and progressing well with mobility  Follow Up Recommendations  SNF     Equipment Recommendations  Rolling walker with 5" wheels    Recommendations for Other Services OT consult     Precautions / Restrictions Precautions Precautions: Fall Restrictions Weight Bearing Restrictions: No Other Position/Activity Restrictions: WBAT    Mobility  Bed Mobility                  Transfers Overall transfer level: Needs assistance Equipment used: Rolling walker (2 wheeled) Transfers: Sit to/from Stand Sit to Stand: Min assist         General transfer comment: verbal cues for hand placement and LE management.   Ambulation/Gait Ambulation/Gait assistance: Min assist Ambulation Distance (Feet): 100 Feet (twice) Assistive device: Rolling walker (2 wheeled) Gait Pattern/deviations: Step-to pattern;Decreased step length - right;Decreased step length - left;Shuffle;Trunk flexed Gait velocity: decr   General Gait Details: cues for posture, sequence, position from RW and ER on R   Stairs            Wheelchair Mobility    Modified Rankin (Stroke Patients Only)       Balance                                    Cognition Arousal/Alertness: Awake/alert Behavior During Therapy: WFL for tasks assessed/performed Overall Cognitive Status: Within Functional Limits for tasks assessed                      Exercises      General Comments        Pertinent Vitals/Pain Pain Assessment: 0-10 Pain Score: 3  Pain Location: R hip Pain Descriptors / Indicators: Aching;Burning;Sore Pain Intervention(s): Limited activity within patient's tolerance;Premedicated before session;Monitored during session;Ice applied    Home Living                       Prior Function            PT Goals (current goals can now be found in the care plan section) Acute Rehab PT Goals Patient Stated Goal: go to rehab to increase independence PT Goal Formulation: With patient Time For Goal Achievement: 02/22/15 Potential to Achieve Goals: Good Progress towards PT goals: Progressing toward goals    Frequency  7X/week    PT Plan Current plan remains appropriate    Co-evaluation             End of Session Equipment Utilized During Treatment: Gait belt Activity Tolerance: Patient tolerated treatment well Patient left: Other (comment) (EOB with CNA for bathing)     Time: 2952-84131630-1655 PT Time Calculation (min) (ACUTE ONLY): 25 min  Charges:  $Gait Training: 23-37 mins                    G Codes:      Nobuo Nunziata 02/15/2015, 5:04 PM

## 2015-02-15 NOTE — Evaluation (Signed)
Occupational Therapy Evaluation Patient Details Name: JAIS DEMIR MRN: [id number] DOB: 01/17/1956 Today's Date: 02/15/2015    History of Present Illness Pt is s/p R direct anterior THA   Clinical Impression   Pt up to the 3in1 to practice toilet transfers and also worked on LB self care of donning/doffing shoes. Educated on AE options available. Will follow on acute to progress ADL independence for next venue.    Follow Up Recommendations  SNF;Supervision/Assistance - 24 hour    Equipment Recommendations  3 in 1 bedside comode    Recommendations for Other Services       Precautions / Restrictions Precautions Precautions: Fall Restrictions Weight Bearing Restrictions: No Other Position/Activity Restrictions: WBAT      Mobility Bed Mobility Overal bed mobility: Needs Assistance Bed Mobility: Supine to Sit     Supine to sit: Min assist;Mod assist     General bed mobility comments: cues for sequence and use of L LE to self assist  Transfers Overall transfer level: Needs assistance Equipment used: Rolling walker (2 wheeled) Transfers: Sit to/from Stand Sit to Stand: Min assist         General transfer comment: verbal cues for hand placement and LE management.     Balance                                            ADL Overall ADL's : Needs assistance/impaired Eating/Feeding: Independent;Sitting   Grooming: Wash/dry hands;Set up;Sitting   Upper Body Bathing: Set up;Sitting   Lower Body Bathing: Moderate assistance;Sit to/from stand   Upper Body Dressing : Set up;Sitting   Lower Body Dressing: Moderate assistance;Sit to/from stand Lower Body Dressing Details (indicate cue type and reason): pt donned L shoe, needed assist to get foot into R shoe and with laces. Pt able to doff bilateral shoe Toilet Transfer: Minimal assistance;Ambulation;BSC;RW   Toileting- Clothing Manipulation and Hygiene: Minimal assistance;Sit to/from stand          General ADL Comments: Discussed AE options and pt is hoping that he wont need this AE by the time he goes home. Also educated on 3in1 option for over the commode. Needed assist with donning R shoe as it is a boot and it was difficult for pt to get his foot down in the shoe.      Vision     Perception     Praxis      Pertinent Vitals/Pain Pain Assessment: 0-10 Pain Score: 5  Pain Location: R hip Pain Descriptors / Indicators: Burning Pain Intervention(s): Repositioned;Ice applied     Hand Dominance     Extremity/Trunk Assessment Upper Extremity Assessment Upper Extremity Assessment: Overall WFL for tasks assessed      Cervical / Trunk Assessment Cervical / Trunk Assessment: Normal   Communication Communication Communication: No difficulties   Cognition Arousal/Alertness: Awake/alert Behavior During Therapy: WFL for tasks assessed/performed Overall Cognitive Status: Within Functional Limits for tasks assessed                     General Comments       Exercises      Shoulder Instructions      Home Living Family/patient expects to be discharged to:: Skilled nursing facility Living Arrangements: Alone  Additional Comments: Pt has no assist at home      Prior Functioning/Environment Level of Independence: Independent        Comments: Worked as [employer]    OT Diagnosis: Generalized weakness   OT Problem List: Decreased strength;Decreased knowledge of use of DME or AE   OT Treatment/Interventions: Self-care/ADL training;Patient/family education;Therapeutic activities;DME and/or AE instruction    OT Goals(Current goals can be found in the care plan section) Acute Rehab OT Goals Patient Stated Goal: go to rehab to increase independence OT Goal Formulation: With patient Time For Goal Achievement: 02/22/15 Potential to Achieve Goals: Good  OT Frequency: Min 2X/week   Barriers to D/C:             Co-evaluation              End of Session Equipment Utilized During Treatment: Rolling walker  Activity Tolerance: Patient tolerated treatment well Patient left: in chair;with call bell/phone within reach   Time: [id number] OT Time Calculation (min): 28 min Charges:  OT General Charges $OT Visit: 1 Procedure OT Evaluation $Initial OT Evaluation Tier I: 1 Procedure OT Treatments $Therapeutic Activity: 8-22 mins G-Codes:    Lennox LaityStone, Hye Trawick Stafford  [phone] 02/15/2015, 11:44 AM

## 2015-02-15 NOTE — Progress Notes (Signed)
Clinical Social Work Department BRIEF PSYCHOSOCIAL ASSESSMENT 02/15/2015  Patient:  Bill [name]     Account Number:  [id number]     Admit date:  02/14/2015  Clinical Social Worker:  GERBER,HOLLY, LCSW  Date/Time:  02/15/2015 03:00 PM  Referred by:  Physician  Date Referred:  02/15/2015 Referred for  SNF Placement   Other Referral:   Interview type:  Patient Other interview type:    PSYCHOSOCIAL DATA Living Status:  FAMILY Admitted from facility:   Level of care:   Primary support name:  Kenneth Primary support relationship to patient:  SIBLING Degree of support available:   Adequate    CURRENT CONCERNS Current Concerns  Post-Acute Placement   Other Concerns:    SOCIAL WORK ASSESSMENT / PLAN CSW received referral in order to assist with DC planning. Per chart review, PT/OT recommending SNF placement at DC. CSW reviewed chart and met with patient at bedside. CSW introduced myself and explained role.    Patient reports that he was living at home but made arrangements for DC prior to surgery. Patient had previously toured [facility] and agreeable to go there for rehab. When CSW entered room, [facility] representative was in room and completing admission paperwork.    FL2 completed and all clinicals sent to [facility] for insurance approval. CSW will continue to follow to assist with DC planning.   Assessment/plan status:  Psychosocial Support/Ongoing Assessment of Needs Other assessment/ plan:   Information/referral to community resources:   SNF information    PATIENT'S/FAMILY'S RESPONSE TO PLAN OF CARE: Patient alert and oriented. Patient engaged with [facility] to complete forms. Patient denies any questions at this point and understanding that CSW is working with SNF for insurance approval. Patient understands that CSW will assist with transfer to SNF once he is medically stable.       Holly Gerber, LCSW (Coverage for [facility]iding[facility]) 

## 2015-02-15 NOTE — Discharge Instructions (Addendum)
°Dr. Frank Aluisio °Total Joint Specialist °[facility] °[address] °[place], [place] [address] °[phone] ° ° ° °ANTERIOR APPROACH TOTAL HIP REPLACEMENT POSTOPERATIVE DIRECTIONS ° ° °Hip Rehabilitation, Guidelines Following Surgery  °The results of a hip operation are greatly improved after range of motion and muscle strengthening exercises. Follow all safety measures which are given to protect your hip. If any of these exercises cause increased pain or swelling in your joint, decrease the amount until you are comfortable again. Then slowly increase the exercises. Call your caregiver if you have problems or questions.  °HOME CARE INSTRUCTIONS  °Most of the following instructions are designed to prevent the dislocation of your new hip.  °Remove items at home which could result in a fall. This includes throw rugs or furniture in walking pathways.  °Continue medications as instructed at time of discharge. °· You may have some home medications which will be placed on hold until you complete the course of blood thinner medication. °· You may start showering once you are discharged home but do not submerge the incision under water. Just pat the incision dry and apply a dry gauze dressing on daily. °Do not put on socks or shoes without following the instructions of your caregivers.  °Sit on high chairs which makes it easier to stand.  °Sit on chairs with arms. Use the chair arms to help push yourself up when arising.  °Keep your leg on the side of the operation out in front of you when standing up.  °Arrange for the use of a toilet seat elevator so you are not sitting low.   °· Walk with walker as instructed.  °You may resume a sexual relationship in one month or when given the OK by your caregiver.  °Use walker as long as suggested by your caregivers.  °You may put full weight on your legs and walk as much as is comfortable. °Avoid periods of inactivity such as sitting longer than an hour  when not asleep. This helps prevent blood clots.  °You may return to work once you are cleared by your surgeon.  °Do not drive a car for 6 weeks or until released by your surgeon.  °Do not drive while taking narcotics.  °Wear elastic stockings for three weeks following surgery during the day but you may remove then at night.  °Make sure you keep all of your appointments after your operation with all of your doctors and caregivers. You should call the office at the above phone number and make an appointment for approximately two weeks after the date of your surgery. °Change the dressing daily and reapply a dry dressing each time. °Please pick up a stool softener and laxative for home use as long as you are requiring pain medications. °· ICE to the affected hip every three hours for 30 minutes at a time and then as needed for pain and swelling.  Continue to use ice on the hip for pain and swelling from surgery. You may notice swelling that will progress down to the foot and ankle.  This is normal after surgery.  Elevate the leg when you are not up walking on it.   °It is important for you to complete the blood thinner medication as prescribed by your doctor. °· Continue to use the breathing machine which will help keep your temperature down.  It is common for your temperature to cycle up and down following surgery, especially at night when you are not up moving around   and exerting yourself.  The breathing machine keeps your lungs expanded and your temperature down. ° °RANGE OF MOTION AND STRENGTHENING EXERCISES  °These exercises are designed to help you keep full movement of your hip joint. Follow your caregiver's or physical therapist's instructions. Perform all exercises about fifteen times, three times per day or as directed. Exercise both hips, even if you have had only one joint replacement. These exercises can be done on a training (exercise) mat, on the floor, on a table or on a bed. Use whatever works the best  and is most comfortable for you. Use music or television while you are exercising so that the exercises are a pleasant break in your day. This will make your life better with the exercises acting as a break in routine you can look forward to.  °Lying on your back, slowly slide your foot toward your buttocks, raising your knee up off the floor. Then slowly slide your foot back down until your leg is straight again.  °Lying on your back spread your legs as far apart as you can without causing discomfort.  °Lying on your side, raise your upper leg and foot straight up from the floor as far as is comfortable. Slowly lower the leg and repeat.  °Lying on your back, tighten up the muscle in the front of your thigh (quadriceps muscles). You can do this by keeping your leg straight and trying to raise your heel off the floor. This helps strengthen the largest muscle supporting your knee.  °Lying on your back, tighten up the muscles of your buttocks both with the legs straight and with the knee bent at a comfortable angle while keeping your heel on the floor.  ° °SKILLED REHAB INSTRUCTIONS: °If the patient is transferred to a skilled rehab facility following release from the hospital, a list of the current medications will be sent to the facility for the patient to continue.  When discharged from the skilled rehab facility, please have the facility set up the patient's Home Health Physical Therapy prior to being released. Also, the skilled facility will be responsible for providing the patient with their medications at time of release from the facility to include their pain medication, the muscle relaxants, and their blood thinner medication. If the patient is still at the rehab facility at time of the two week follow up appointment, the skilled rehab facility will also need to assist the patient in arranging follow up appointment in our office and any transportation needs. ° °MAKE SURE YOU:  °Understand these instructions.   °Will watch your condition.  °Will get help right away if you are not doing well or get worse. ° °Pick up stool softner and laxative for home use following surgery while on pain medications. °Do not submerge incision under water. °Please use good hand washing techniques while changing dressing each day. °May shower starting three days after surgery. °Please use a clean towel to pat the incision dry following showers. °Continue to use ice for pain and swelling after surgery. °Do not use any lotions or creams on the incision until instructed by your surgeon. °Total Hip Protocol. ° °Take Xarelto for two and a half more weeks, then discontinue Xarelto. °Once the patient has completed the blood thinner regimen, then take a Baby 81 mg Aspirin daily for three more weeks. ° °Postoperative Constipation Protocol ° °Constipation - defined medically as fewer than three stools per week and severe constipation as less than one stool per week. ° °One   of the most common issues patients have following surgery is constipation.  Even if you have a regular bowel pattern at home, your normal regimen is likely to be disrupted due to multiple reasons following surgery.  Combination of anesthesia, postoperative narcotics, change in appetite and fluid intake all can affect your bowels.  In order to avoid complications following surgery, here are some recommendations in order to help you during your recovery period. ° °Colace (docusate) - Pick up an over-the-counter form of Colace or another stool softener and take twice a day as long as you are requiring postoperative pain medications.  Take with a full glass of water daily.  If you experience loose stools or diarrhea, hold the colace until you stool forms back up.  If your symptoms do not get better within 1 week or if they get worse, check with your doctor. ° °Dulcolax (bisacodyl) - Pick up over-the-counter and take as directed by the product packaging as needed to assist with the  movement of your bowels.  Take with a full glass of water.  Use this product as needed if not relieved by Colace only.  ° °MiraLax (polyethylene glycol) - Pick up over-the-counter to have on hand.  MiraLax is a solution that will increase the amount of water in your bowels to assist with bowel movements.  Take as directed and can mix with a glass of water, juice, soda, coffee, or tea.  Take if you go more than two days without a movement. °Do not use MiraLax more than once per day. Call your doctor if you are still constipated or irregular after using this medication for 7 days in a row. ° °If you continue to have problems with postoperative constipation, please contact the office for further assistance and recommendations.  If you experience "the worst abdominal pain ever" or develop nausea or vomiting, please contact the office immediatly for further recommendations for treatment. ° °When discharged from the skilled rehab facility, please have the facility set up the patient's Home Health Physical Therapy prior to being released.   °Also provide the patient with their medications at time of release from the facility to include their pain medication, the muscle relaxants, and their blood thinner medication.  If the patient is still at the rehab facility at time of follow up appointment, please also assist the patient in arranging follow up appointment in our office and any transportation needs. °ICE to the affected knee or hip every three hours for 30 minutes at a time and then as needed for pain and swelling. ° ° ° °Information on my medicine - XARELTO® (Rivaroxaban) ° °This medication education was reviewed with me or my healthcare representative as part of my discharge preparation.  The pharmacist that spoke with me during my hospital stay was:  Jackson, Rachel E, RPH ° °Why was Xarelto® prescribed for you? °Xarelto® was prescribed for you to reduce the risk of blood clots forming after orthopedic surgery. The  medical term for these abnormal blood clots is venous thromboembolism (VTE). ° °What do you need to know about xarelto® ? °Take your Xarelto® ONCE DAILY at the same time every day. °You may take it either with or without food. ° °If you have difficulty swallowing the tablet whole, you may crush it and mix in applesauce just prior to taking your dose. ° °Take Xarelto® exactly as prescribed by your doctor and DO NOT stop taking Xarelto® without talking to the doctor who prescribed the medication.  Stopping without   other VTE prevention medication to take the place of Xarelto® may increase your risk of developing a clot. ° °After discharge, you should have regular check-up appointments with your healthcare provider that is prescribing your Xarelto®.   ° °What do you do if you miss a dose? °If you miss a dose, take it as soon as you remember on the same day then continue your regularly scheduled once daily regimen the next day. Do not take two doses of Xarelto® on the same day.  ° °Important Safety Information °A possible side effect of Xarelto® is bleeding. You should call your healthcare provider right away if you experience any of the following: °? Bleeding from an injury or your nose that does not stop. °? Unusual colored urine (red or dark brown) or unusual colored stools (red or black). °? Unusual bruising for unknown reasons. °? A serious fall or if you hit your head (even if there is no bleeding). ° °Some medicines may interact with Xarelto® and might increase your risk of bleeding while on Xarelto®. To help avoid this, consult your healthcare provider or pharmacist prior to using any new prescription or non-prescription medications, including herbals, vitamins, non-steroidal anti-inflammatory drugs (NSAIDs) and supplements. ° °This website has more information on Xarelto®: www.xarelto.com. ° ° °

## 2015-02-15 NOTE — Progress Notes (Signed)
Clinical Social Work Department CLINICAL SOCIAL WORK PLACEMENT NOTE 02/15/2015  Patient:  Bill [name],Bill [name]  Account Number:  [id number] Admit date:  02/14/2015  Clinical Social Worker:  Unk LightningHOLLY Ryer Asato, LCSW  Date/time:  02/15/2015 03:00 PM  Clinical Social Work is seeking post-discharge placement for this patient at the following level of care:   SKILLED NURSING   (*CSW will update this form in Epic as items are completed)   02/15/2015  [facility]family provided with [facility] of Clinical [facility] Work's list of facilities offering this level of care within the geographic area requested by the patient (or if unable, by the patient's family).  02/15/2015  Patient/family informed of their freedom to choose among providers that offer the needed level of care, that participate in Medicare, Medicaid or managed care program needed by the patient, have an available bed and are willing to accept the patient.  02/15/2015  Patient/family informed of [facility]' ownership interest in [facility], as well as of the fact that they are under no obligation to receive care at this facility.  PASARR submitted to EDS on 02/15/2015 PASARR number received on 02/15/2015  FL2 transmitted to all facilities in geographic area requested by pt/family on  02/15/2015 FL2 transmitted to all facilities within larger geographic area on   Patient informed that his/her managed care company has contracts with or will negotiate with  certain facilities, including the following:     Patient/family informed of bed offers received:  02/15/2015 [facility] Physician recommends and patient chooses bed at    [facility]   Patient to be transferred to facility by  Patient and family notified of transfer on  Name of family member notified:    The following physician request were entered in Epic:   Additional Comments:

## 2015-02-15 NOTE — Progress Notes (Signed)
Utilization review completed.  

## 2015-02-15 NOTE — Evaluation (Signed)
Physical Therapy Evaluation Patient Details Name: Bill [name] MRN: [id number] DOB: 1956-02-25 Today's Date: 02/15/2015   History of Present Illness  Pt is s/p R direct anterior THA  Clinical Impression  Pt s/p R THR presents with decreased R LE strength/ROM and post op pain limiting functional mobility.  Pt would benefit from follow up rehab at SNF level to maximize independence and safety prior to return home ALONE.    Follow Up Recommendations SNF    Equipment Recommendations  Rolling walker with 5" wheels    Recommendations for Other Services OT consult     Precautions / Restrictions Precautions Precautions: Fall Restrictions Weight Bearing Restrictions: No Other Position/Activity Restrictions: WBAT      Mobility  Bed Mobility Overal bed mobility: Needs Assistance Bed Mobility: Supine to Sit     Supine to sit: Min assist;Mod assist     General bed mobility comments: cues for sequence and use of L LE to self assist  Transfers Overall transfer level: Needs assistance Equipment used: Rolling walker (2 wheeled) Transfers: Sit to/from Stand Sit to Stand: Min assist;From elevated surface         General transfer comment: cues for LE management and use of UEs to self assist  Ambulation/Gait Ambulation/Gait assistance: Min assist Ambulation Distance (Feet): 50 Feet (twice) Assistive device: Rolling walker (2 wheeled) Gait Pattern/deviations: Step-to pattern;Decreased step length - right;Decreased step length - left;Shuffle;Antalgic Gait velocity: decr   General Gait Details: cues for posture, sequence, position from RW and ER on R  Stairs            Wheelchair Mobility    Modified Rankin (Stroke Patients Only)       Balance                                             Pertinent Vitals/Pain Pain Assessment: 0-10 Pain Score: 5  Pain Location: R hip Pain Descriptors / Indicators: Burning Pain Intervention(s): Repositioned;Ice  applied    Home Living Family/patient expects to be discharged to:: Skilled nursing facility Living Arrangements: Alone               Additional Comments: Pt has no assist at home    Prior Function Level of Independence: Independent         Comments: Worked as Scientist, forensic        Extremity/Trunk Assessment   Upper Extremity Assessment: Overall WFL for tasks assessed           Lower Extremity Assessment: RLE deficits/detail;LLE deficits/detail RLE Deficits / Details: 2+/5 hip strength with AAROM at hip to 85 flex and 10 abd LLE Deficits / Details: ltd movement at ankle - previous fx  Cervical / Trunk Assessment: Normal  Communication   Communication: No difficulties  Cognition Arousal/Alertness: Awake/alert Behavior During Therapy: WFL for tasks assessed/performed Overall Cognitive Status: Within Functional Limits for tasks assessed                      General Comments      Exercises Total Joint Exercises Ankle Circles/Pumps: AROM;Both;15 reps;Supine Quad Sets: AROM;Both;10 reps;Supine Heel Slides: AAROM;Right;20 reps;Supine Hip ABduction/ADduction: AAROM;Right;10 reps;Supine      Assessment/Plan    PT Assessment Patient needs continued PT services  PT Diagnosis Difficulty walking   PT Problem List Decreased strength;Decreased range of motion;Decreased activity  tolerance;Decreased mobility;Decreased knowledge of use of DME;Pain  PT Treatment Interventions Gait training;DME instruction;Stair training;Functional mobility training;Therapeutic activities;Therapeutic exercise;Patient/family education   PT Goals (Current goals can be found in the Care Plan section) Acute Rehab PT Goals Patient Stated Goal: Rehab and then home alone to resume previous lifestyle with decreased pain PT Goal Formulation: With patient Time For Goal Achievement: 02/22/15 Potential to Achieve Goals: Good    Frequency 7X/week   Barriers to discharge         Co-evaluation               End of Session   Activity Tolerance: Patient tolerated treatment well Patient left: in chair;with call bell/phone within reach Nurse Communication: Mobility status         Time: [id number] PT Time Calculation (min) (ACUTE ONLY): 32 min   Charges:   PT Evaluation $Initial PT Evaluation Tier I: 1 Procedure PT Treatments $Therapeutic Exercise: 8-22 mins   PT G Codes:        [name] 02/15/2015, 11:43 AM

## 2015-02-15 NOTE — Progress Notes (Signed)
   Subjective: 1 Day Post-Op Procedure(s) (LRB): RIGHT TOTAL HIP ARTHROPLASTY ANTERIOR APPROACH (Right) Patient reports pain as mild.   Patient seen in rounds with Dr. Lequita HaltAluisio.  Not much sleep last night. Patient is well, but has had some minor complaints of pain in the hip, requiring pain medications We will start therapy today.  [facility] hospital stay. Will start the insurance process.  If approved and bed becomes available, then possible transfer tomorrow, if not, then likely Monday.  Objective: Vital signs in last 24 hours: Temp:  [97.4 F (36.3 C)-98.2 F (36.8 C)] 98 F (36.7 C) (02/19 0510) Pulse Rate:  [63-91] 89 (02/19 0510) Resp:  [11-20] 20 (02/19 0510) BP: (105-137)/(61-76) 113/61 mmHg (02/19 0510) SpO2:  [97 %-100 %] 97 % (02/19 0510) Weight:  [85.276 kg (188 lb)-85.503 kg (188 lb 8 oz)] 85.276 kg (188 lb) (02/18 1726)  Intake/Output from previous day:  Intake/Output Summary (Last 24 hours) at 02/15/15 0927 Last data filed at 02/15/15 0850  Gross per 24 hour  Intake 3213.75 ml  Output   4050 ml  Net -836.25 ml    Intake/Output this shift: Total I/O In: 340 [P.O.:240; Other:100] Out: 450 [Urine:450]  Labs:  Recent Labs  02/15/15 0440  HGB 15.8    Recent Labs  02/15/15 0440  WBC 14.9*  RBC 5.05  HCT 46.2  PLT 227    Recent Labs  02/15/15 0440  NA 138  K 4.1  CL 106  CO2 25  BUN 12  CREATININE 0.64  GLUCOSE 151*  CALCIUM 8.8   No results for input(s): LABPT, INR in the last 72 hours.  EXAM General - Patient is Alert, Appropriate and Oriented Extremity - Neurovascular intact Sensation intact distally Dorsiflexion/Plantar flexion intact Dressing - dressing C/D/I Motor Function - intact, moving foot and toes well on exam.  Hemovac pulled without difficulty.  Past Medical History  Diagnosis Date  . Sleep apnea     uses CPAP  . Arthritis   . Anginal pain     hx of noted ER visit in 2001 pt had CXR preformed  01/30/2000 discussed CP issues pt states never had to followup with cardiologist denies further issues     Assessment/Plan: 1 Day Post-Op Procedure(s) (LRB): RIGHT TOTAL HIP ARTHROPLASTY ANTERIOR APPROACH (Right) Principal Problem:   OA (osteoarthritis) of hip  Estimated body mass index is 24.81 kg/(m^2) as calculated from the following:   Height as of this encounter: 6\' 1"  (1.854 m).   Weight as of this encounter: 85.276 kg (188 lb). Advance diet Up with therapy Discharge to SNF when bed available and insurance approves  DVT Prophylaxis - Xarelto Weight Bearing As Tolerated right Leg Hemovac Pulled Begin Therapy  Avel Peacerew Winn Muehl, PA-C Orthopaedic Surgery 02/15/2015, 9:27 AM

## 2015-02-15 NOTE — Discharge Summary (Signed)
Physician Discharge Summary   Patient ID: Bill [name] MRN: [id number] DOB/AGE: April 15, 1956 59 y.o.  Admit date: 02/14/2015 Discharge date: Tentative Date of Discharge - Saturday 02/16/2015  Primary Diagnosis:  Osteoarthritis of the Right hip.   Admission Diagnoses:  Past Medical History  Diagnosis Date  . Sleep apnea     uses CPAP  . Arthritis   . Anginal pain     hx of noted ER visit in 2001 pt had CXR preformed 01/30/2000 discussed CP issues pt states never had to followup with cardiologist denies further issues    Discharge Diagnoses:   Principal Problem:   OA (osteoarthritis) of hip  Estimated body mass index is 24.81 kg/(m^2) as calculated from the following:   Height as of this encounter: 6' 1"  (1.854 m).   Weight as of this encounter: 85.276 kg (188 lb).  Procedure(s) (LRB): RIGHT TOTAL HIP ARTHROPLASTY ANTERIOR APPROACH (Right)   Consults: None  HPI: Bill [name] is a 59 y.o. male who has advanced end-  stage arthritis of his Right hip with progressively worsening pain and  dysfunction.The patient has failed nonoperative management and presents for  total hip arthroplasty.  Laboratory Data: Admission on 02/14/2015  Component Date Value Ref Range Status  . ABO/RH(D) 02/14/2015 A NEG   Final  . Antibody Screen 02/14/2015 NEG   Final  . Sample Expiration 02/14/2015 02/17/2015   Final  . ABO/RH(D) 02/14/2015 A NEG   Final  . WBC 02/15/2015 14.9* 4.0 - 10.5 K/uL Final  . RBC 02/15/2015 5.05  4.22 - 5.81 MIL/uL Final  . Hemoglobin 02/15/2015 15.8  13.0 - 17.0 g/dL Final  . HCT 02/15/2015 46.2  39.0 - 52.0 % Final  . MCV 02/15/2015 91.5  78.0 - 100.0 fL Final  . MCH 02/15/2015 31.3  26.0 - 34.0 pg Final  . MCHC 02/15/2015 34.2  30.0 - 36.0 g/dL Final  . RDW 02/15/2015 12.7  11.5 - 15.5 % Final  . Platelets 02/15/2015 227  150 - 400 K/uL Final  . Sodium 02/15/2015 138  135 - 145 mmol/L Final  . Potassium 02/15/2015 4.1  3.5 - 5.1 mmol/L Final  . Chloride 02/15/2015  106  96 - 112 mmol/L Final  . CO2 02/15/2015 25  19 - 32 mmol/L Final  . Glucose, Bld 02/15/2015 151* 70 - 99 mg/dL Final  . BUN 02/15/2015 12  6 - 23 mg/dL Final  . Creatinine, Ser 02/15/2015 0.64  0.50 - 1.35 mg/dL Final  . Calcium 02/15/2015 8.8  8.4 - 10.5 mg/dL Final  . GFR calc non Af Amer 02/15/2015 >90  >90 mL/min Final  . GFR calc Af Amer 02/15/2015 >90  >90 mL/min Final   Comment: (NOTE) The eGFR has been calculated using the CKD EPI equation. This calculation has not been validated in all clinical situations. eGFR's persistently <90 mL/min signify possible Chronic Kidney Disease.   Georgiann Hahn gap 02/15/2015 7  5 - 15 Final  Hospital Outpatient Visit on 02/08/2015  Component Date Value Ref Range Status  . MRSA, PCR 02/08/2015 NEGATIVE  NEGATIVE Final  . Staphylococcus aureus 02/08/2015 NEGATIVE  NEGATIVE Final   Comment:        The Xpert SA Assay (FDA approved for NASAL specimens in patients over 78 years of age), is one component of a comprehensive surveillance program.  Test performance has been validated by [facility] for patients greater than or equal to 4 year old. It is not intended to diagnose infection nor  to guide or monitor treatment.   Marland Kitchen aPTT 02/08/2015 33  24 - 37 seconds Final  . WBC 02/08/2015 7.5  4.0 - 10.5 K/uL Final  . RBC 02/08/2015 5.43  4.22 - 5.81 MIL/uL Final  . Hemoglobin 02/08/2015 17.1* 13.0 - 17.0 g/dL Final  . HCT 02/08/2015 50.4  39.0 - 52.0 % Final  . MCV 02/08/2015 92.8  78.0 - 100.0 fL Final  . MCH 02/08/2015 31.5  26.0 - 34.0 pg Final  . MCHC 02/08/2015 33.9  30.0 - 36.0 g/dL Final  . RDW 02/08/2015 13.1  11.5 - 15.5 % Final  . Platelets 02/08/2015 304  150 - 400 K/uL Final  . Sodium 02/08/2015 136  135 - 145 mmol/L Final  . Potassium 02/08/2015 3.9  3.5 - 5.1 mmol/L Final  . Chloride 02/08/2015 98  96 - 112 mmol/L Final  . CO2 02/08/2015 30  19 - 32 mmol/L Final  . Glucose, Bld 02/08/2015 88  70 - 99 mg/dL Final  . BUN  02/08/2015 20  6 - 23 mg/dL Final  . Creatinine, Ser 02/08/2015 0.73  0.50 - 1.35 mg/dL Final  . Calcium 02/08/2015 9.7  8.4 - 10.5 mg/dL Final  . Total Protein 02/08/2015 7.8  6.0 - 8.3 g/dL Final  . Albumin 02/08/2015 4.6  3.5 - 5.2 g/dL Final  . AST 02/08/2015 23  0 - 37 U/L Final  . ALT 02/08/2015 15  0 - 53 U/L Final  . Alkaline Phosphatase 02/08/2015 67  39 - 117 U/L Final  . Total Bilirubin 02/08/2015 0.9  0.3 - 1.2 mg/dL Final  . GFR calc non Af Amer 02/08/2015 >90  >90 mL/min Final  . GFR calc Af Amer 02/08/2015 >90  >90 mL/min Final   Comment: (NOTE) The eGFR has been calculated using the CKD EPI equation. This calculation has not been validated in all clinical situations. eGFR's persistently <90 mL/min signify possible Chronic Kidney Disease.   . Anion gap 02/08/2015 8  5 - 15 Final  . Prothrombin Time 02/08/2015 12.8  11.6 - 15.2 seconds Final  . INR 02/08/2015 0.96  0.00 - 1.49 Final  . Color, Urine 02/08/2015 YELLOW  YELLOW Final  . APPearance 02/08/2015 CLEAR  CLEAR Final  . Specific Gravity, Urine 02/08/2015 1.016  1.005 - 1.030 Final  . pH 02/08/2015 6.0  5.0 - 8.0 Final  . Glucose, UA 02/08/2015 NEGATIVE  NEGATIVE mg/dL Final  . Hgb urine dipstick 02/08/2015 NEGATIVE  NEGATIVE Final  . Bilirubin Urine 02/08/2015 NEGATIVE  NEGATIVE Final  . Ketones, ur 02/08/2015 NEGATIVE  NEGATIVE mg/dL Final  . Protein, ur 02/08/2015 NEGATIVE  NEGATIVE mg/dL Final  . Urobilinogen, UA 02/08/2015 0.2  0.0 - 1.0 mg/dL Final  . Nitrite 02/08/2015 NEGATIVE  NEGATIVE Final  . Leukocytes, UA 02/08/2015 NEGATIVE  NEGATIVE Final   MICROSCOPIC NOT DONE ON URINES WITH NEGATIVE PROTEIN, BLOOD, LEUKOCYTES, NITRITE, OR GLUCOSE <1000 mg/dL.     X-Rays:Dg Pelvis Portable  02/14/2015   CLINICAL DATA:  Status post total hip arthroplasty  EXAM: DG C-ARM 1-60 MIN - NRPT [facility]; PORTABLE PELVIS 1-2 VIEWS  COMPARISON:  None.  FLUOROSCOPY TIME:  Fluoroscopy Time:  0 minutes 18 seconds  Number of  Acquired Images:  1  FINDINGS: There is a total hip prosthesis on the right which appears well seated. No fracture or dislocation. There is mild narrowing of the left hip joint. There is a surgical drain on the right. Soft tissue air on the right is an  expected postoperative finding.  IMPRESSION: Total hip prosthesis on the right appears well-seated. No acute fracture or dislocation.   Electronically Signed   By: Lowella Grip III M.D.   On: 02/14/2015 16:00   Dg C-arm 1-60 Min-no Report  02/14/2015   CLINICAL DATA: right anterior hip   C-ARM 1-60 MINUTES  Fluoroscopy was utilized by the requesting physician.  No radiographic  interpretation.     EKG:No orders found for this or any previous visit.   Hospital Course: Patient was admitted to [facility] and taken to the OR and underwent the above state procedure without complications.  Patient tolerated the procedure well and was later transferred to the recovery room and then to the orthopaedic floor for postoperative care.  They were given PO and IV analgesics for pain control following their surgery.  They were given 24 hours of postoperative antibiotics of  Anti-infectives    Start     Dose/Rate Route Frequency Ordered Stop   02/14/15 2000  ceFAZolin (ANCEF) IVPB 2 g/50 mL premix     2 g 100 mL/hr over 30 Minutes Intravenous Every 6 hours 02/14/15 1736 02/15/15 0136   02/14/15 1044  ceFAZolin (ANCEF) IVPB 2 g/50 mL premix     2 g 100 mL/hr over 30 Minutes Intravenous On call to O.R. 02/14/15 1044 02/14/15 1332     and started on DVT prophylaxis in the form of Xarelto.   PT and OT were ordered for total hip protocol.  The patient was allowed to be WBAT with therapy. Discharge planning was consulted to help with postop disposition and equipment needs.  Social worker was consulted to assist with placement of the patient into a skilled rehab facility postop.  Patient had a tough night on the evening of surgery with not much sleep.  They  started to get up OOB with therapy on day one.  Hemovac drain was pulled without difficulty.  The knee immobilizer was removed and discontinued.  Patient was seen in rounds on day one by Dr. Wynelle Link and was doing well.  It was felt that as long as the patient continued to improve, they would be ready to transfer the following day, Saturday 02/16/2015 if insurance approves the transfer.  The patient will evaluated by the weekend coverage staff and will discharge if doing well.  This summary was prepared in anticipation of the the patient's transfer over the weekend.   Diet: Regular diet Activity:WBAT Follow-up:in 2 weeks Disposition - Skilled nursing facility Discharged Condition: Pending at time of summary, Transfer on Saturday if doing well on weekend rounds.   Discharge Instructions    Call MD / Call 911    Complete by:  As directed   If you experience chest pain or shortness of breath, CALL 911 and be transported to the hospital emergency room.  If you develope a fever above 101 F, pus (white drainage) or increased drainage or redness at the wound, or calf pain, call your surgeon's office.     Change dressing    Complete by:  As directed   You may change your dressing dressing daily with sterile 4 x 4 inch gauze dressing and paper tape.  Do not submerge the incision under water.     Constipation Prevention    Complete by:  As directed   Drink plenty of fluids.  Prune juice may be helpful.  You may use a stool softener, such as Colace (over the counter) 100 mg twice a day.  Use MiraLax (over the counter) for constipation as needed.     Diet - low sodium heart healthy    Complete by:  As directed      Discharge instructions    Complete by:  As directed   Pick up stool softner and laxative for home use following surgery while on pain medications. Do not submerge incision under water. Please use good hand washing techniques while changing dressing each day. May shower starting three days  after surgery. Please use a clean towel to pat the incision dry following showers. Continue to use ice for pain and swelling after surgery. Do not use any lotions or creams on the incision until instructed by your surgeon.   Total Hip Protocol.  Take Xarelto for two and a half more weeks, then discontinue Xarelto. Once the patient has completed the blood thinner regimen, then take a Baby 81 mg Aspirin daily for three more weeks.  Postoperative Constipation Protocol  Constipation - defined medically as fewer than three stools per week and severe constipation as less than one stool per week.  One of the most common issues patients have following surgery is constipation.  Even if you have a regular bowel pattern at home, your normal regimen is likely to be disrupted due to multiple reasons following surgery.  Combination of anesthesia, postoperative narcotics, change in appetite and fluid intake all can affect your bowels.  In order to avoid complications following surgery, here are some recommendations in order to help you during your recovery period.  Colace (docusate) - Pick up an over-the-counter form of Colace or another stool softener and take twice a day as long as you are requiring postoperative pain medications.  Take with a full glass of water daily.  If you experience loose stools or diarrhea, hold the colace until you stool forms back up.  If your symptoms do not get better within 1 week or if they get worse, check with your doctor.  Dulcolax (bisacodyl) - Pick up over-the-counter and take as directed by the product packaging as needed to assist with the movement of your bowels.  Take with a full glass of water.  Use this product as needed if not relieved by Colace only.   MiraLax (polyethylene glycol) - Pick up over-the-counter to have on hand.  MiraLax is a solution that will increase the amount of water in your bowels to assist with bowel movements.  Take as directed and can mix with  a glass of water, juice, soda, coffee, or tea.  Take if you go more than two days without a movement. Do not use MiraLax more than once per day. Call your doctor if you are still constipated or irregular after using this medication for 7 days in a row.  If you continue to have problems with postoperative constipation, please contact the office for further assistance and recommendations.  If you experience "the worst abdominal pain ever" or develop nausea or vomiting, please contact the office immediatly for further recommendations for treatment.  When discharged from the skilled rehab facility, please have the facility set up the patient's [place] prior to being released.   Also provide the patient with their medications at time of release from the facility to include their pain medication, the muscle relaxants, and their blood thinner medication.  If the patient is still at the rehab facility at time of follow up appointment, please also assist the patient in arranging follow up appointment in our office and  any transportation needs. ICE to the affected knee or hip every three hours for 30 minutes at a time and then as needed for pain and swelling.     Do not sit on low chairs, stoools or toilet seats, as it may be difficult to get up from low surfaces    Complete by:  As directed      Driving restrictions    Complete by:  As directed   No driving until released by the physician.     Increase activity slowly as tolerated    Complete by:  As directed      Lifting restrictions    Complete by:  As directed   No lifting until released by the physician.     Patient may shower    Complete by:  As directed   You may shower without a dressing once there is no drainage.  Do not wash over the wound.  If drainage remains, do not shower until drainage stops.     TED hose    Complete by:  As directed   Use stockings (TED hose) for 3 weeks on both leg(s).  You may remove them at night  for sleeping.     Weight bearing as tolerated    Complete by:  As directed   Laterality:  right  Extremity:  Lower            Medication List    STOP taking these medications        naproxen sodium 220 MG tablet  Commonly known as:  ANAPROX     testosterone cypionate 100 MG/ML injection  Commonly known as:  DEPOTESTOTERONE CYPIONATE      TAKE these medications        acetaminophen 325 MG tablet  Commonly known as:  TYLENOL  Take 2 tablets (650 mg total) by mouth every 6 (six) hours as needed for mild pain (or Fever >/= 101).     bisacodyl 10 MG suppository  Commonly known as:  DULCOLAX  Place 1 suppository (10 mg total) rectally daily as needed for moderate constipation.     docusate sodium 100 MG capsule  Commonly known as:  COLACE  Take 1 capsule (100 mg total) by mouth 2 (two) times daily.     methocarbamol 500 MG tablet  Commonly known as:  ROBAXIN  Take 1 tablet (500 mg total) by mouth every 6 (six) hours as needed for muscle spasms.     metoCLOPramide 5 MG tablet  Commonly known as:  REGLAN  Take 1 tablet (5 mg total) by mouth every 8 (eight) hours as needed for nausea (if ondansetron (ZOFRAN) ineffective.).     ondansetron 4 MG tablet  Commonly known as:  ZOFRAN  Take 1 tablet (4 mg total) by mouth every 6 (six) hours as needed for nausea.     oxyCODONE 5 MG immediate release tablet  Commonly known as:  Oxy IR/ROXICODONE  Take 1-2 tablets (5-10 mg total) by mouth every 3 (three) hours as needed for moderate pain, severe pain or breakthrough pain.     polyethylene glycol packet  Commonly known as:  MIRALAX / GLYCOLAX  Take 17 g by mouth daily as needed for mild constipation.     rivaroxaban 10 MG Tabs tablet  Commonly known as:  XARELTO  - Take 1 tablet (10 mg total) by mouth daily with breakfast. Take Xarelto for two and a half more weeks, then discontinue Xarelto.  - Once the patient has completed  the blood thinner regimen, then take a Baby 81 mg  Aspirin daily for three more weeks.     traMADol 50 MG tablet  Commonly known as:  ULTRAM  Take 1-2 tablets (50-100 mg total) by mouth every 6 (six) hours as needed (mild pain).           Follow-up Information    Follow up with Gearlean Alf, MD. Schedule an appointment as soon as possible for a visit on 02/27/2015.   Specialty:  Orthopedic Surgery   Why:  Call office at [phone] to setup appointment with Dr. Wynelle Link on Wed. 02/27/2015.   Contact information:   [address] [place] [address] [phone]       Signed: Arlee Muslim, PA-C Orthopaedic Surgery 02/15/2015, 9:38 AM

## 2015-02-15 NOTE — Progress Notes (Signed)
CSW continuing to follow.  CSW received notification from [facility] that facility received notification from [employer] that insurance cannot provide authorization until insurance is able to review 2nd day post op notes. [facility] is willing to accept pt on Saturday with a Letter of Guarantee (LOG) until insurance authorization is received as long as 2nd day post op notes are sent on Saturday in order for facility to submit the post op notes to pt insurance on Monday morning.   CSW spoke to CSW director, [facility] who is agreeable to providing 3-day LOG in order for [facility] to submit the requested 2nd day post op notes on Monday to [employer].  CSW director, Hope Rife will send [facility] LOG today in order for pt to be able to admit to [facility] tomorrow.   Weekend CSW to facilitate pt discharge needs to [facility] on Saturday 02/16/2015.  Bill SpecterSuzanna Dorotea [name], MSW, LCSW Clinical Social Work Coverage for [employer] Ha[employer], [place] [phone]

## 2015-02-16 LAB — CBC
HCT: 45.9 % (ref 39.0–52.0)
HEMOGLOBIN: 15.6 g/dL (ref 13.0–17.0)
MCH: 31.3 pg (ref 26.0–34.0)
MCHC: 34 g/dL (ref 30.0–36.0)
MCV: 92 fL (ref 78.0–100.0)
PLATELETS: 224 10*3/uL (ref 150–400)
RBC: 4.99 MIL/uL (ref 4.22–5.81)
RDW: 13 % (ref 11.5–15.5)
WBC: 15 10*3/uL — AB (ref 4.0–10.5)

## 2015-02-16 LAB — BASIC METABOLIC PANEL
Anion gap: 7 (ref 5–15)
BUN: 16 mg/dL (ref 6–23)
CHLORIDE: 99 mmol/L (ref 96–112)
CO2: 27 mmol/L (ref 19–32)
CREATININE: 0.89 mg/dL (ref 0.50–1.35)
Calcium: 8.7 mg/dL (ref 8.4–10.5)
GFR calc non Af Amer: >90 mL/min (ref >90)
Glucose, Bld: 110 mg/dL — ABNORMAL HIGH (ref 70–99)
Potassium: 4 mmol/L (ref 3.5–5.1)
SODIUM: 133 mmol/L — AB (ref 135–145)

## 2015-02-16 NOTE — Clinical Social Work Placement (Signed)
Clinical Social Work Department CLINICAL SOCIAL WORK PLACEMENT NOTE 02/16/2015  Patient:  Bill [name],Bill [name]  Account Number:  [id number] Admit date:  02/14/2015  Clinical Social Worker:  Unk LightningHOLLY GERBER, LCSW  Date/time:  02/15/2015 03:00 PM  Clinical Social Work is seeking post-discharge placement for this patient at the following level of care:   SKILLED NURSING   (*CSW will update this form in Epic as items are completed)   02/15/2015  Patient/family provided with [facility] Department of Clinical Social Work's list of facilities offering this level of care within the geographic area requested by the patient (or if unable, by the patient's family).  02/15/2015  Patient/family informed of their freedom to choose among providers that offer the needed level of care, that participate in Medicare, Medicaid or managed care program needed by the patient, have an available bed and are willing to accept the patient.  02/15/2015  Patient/family informed of [facility]' ownership interest in [facility], as well as of the fact that they are under no obligation to receive care at this facility.  PASARR submitted to EDS on 02/15/2015 PASARR number received on 02/15/2015  FL2 transmitted to all facilities in geographic area requested by pt/family on  02/15/2015 FL2 transmitted to all facilities within larger geographic area on   Patient informed that his/her managed care company has contracts with or will negotiate with  certain facilities, including the following:     Patient/family informed of bed offers received:  02/15/2015 [facility] Physician recommends and patient chooses bed at    [facility] on  02/16/2015 Patient to be transferred to facility by ambulance Patient and family notified of transfer on 02/16/2015 Name of family member notified:  Donnie/sister in Social workerlaw  The following physician request were entered in  Epic:   Additional Comments:  .Elray Bubaegina Oree Hislop, LCSW [facility] Clinical Social Worker - Weekend Coverage cell #: [phone]

## 2015-02-16 NOTE — Progress Notes (Addendum)
     Subjective: 2 Days Post-Op Procedure(s) (LRB): RIGHT TOTAL HIP ARTHROPLASTY ANTERIOR APPROACH (Right)   Patient reports pain as mild, pain controlled. No events throughout the night. Ready to be discharged to SNF, if he does well with PT and pain stays controlled.  Objective:   VITALS:   Filed Vitals:   02/16/15 0640  BP: 113/66  Pulse: 95  Temp: 98.1 F (36.7 C)  Resp: 16    Dorsiflexion/Plantar flexion intact Incision: scant drainage No cellulitis present Compartment soft  LABS  Recent Labs  02/15/15 0440 02/16/15 0500  HGB 15.8 15.6  HCT 46.2 45.9  WBC 14.9* 15.0*  PLT 227 224     Recent Labs  02/15/15 0440 02/16/15 0500  NA 138 133*  K 4.1 4.0  BUN 12 16  CREATININE 0.64 0.89  GLUCOSE 151* 110*     Assessment/Plan: 2 Days Post-Op Procedure(s) (LRB): RIGHT TOTAL HIP ARTHROPLASTY ANTERIOR APPROACH (Right)  Dressing changed with 4x4 gauze and tape Up with therapy Discharge to SNF  [facility]. Follow up with Dr Bill [name] in 2 weeks.  Contact information:  [facility] [address], [address] [place] [id number] [phone]        Bill AuerbachMatthew S. Matilda [name]   PAC  02/16/2015, 8:22 AM

## 2015-02-16 NOTE — Progress Notes (Signed)
CARE MANAGEMENT NOTE 02/16/2015  Patient:  Bill [name],Bill [name]   Account Number:  [id number]  Date Initiated:  02/16/2015  Documentation initiated by:  [facility]Brax Walen  Subjective/Objective Assessment:   RIGHT TOTAL HIP ARTHROPLASTY ANTERIOR APPROACH     Action/Plan:   Anticipated DC Date:  02/16/2015   Anticipated DC Plan:  SKILLED NURSING FACILITY  In-house referral  Clinical Social Worker      DC Planning Services  CM consult      Choice offered to / List presented to:             Status of service:  Completed, signed off Medicare Important Message given?  NO (If response is "NO", the following Medicare IM given date fields will be blank) Date Medicare IM given:   Medicare IM given by:   Date Additional Medicare IM given:   Additional Medicare IM given by:    Discharge Disposition:  SKILLED NURSING FACILITY  Per UR Regulation:  Reviewed for med. necessity/level of care/duration of stay  If discussed at Long Length of Stay Meetings, dates discussed:    Comments:  02/16/2015 1000 Chart reviewed. CSW following for SNF. Isidoro DonningAlesia Kailin Leu RN CCM Case Mgmt phone [phone]

## 2015-02-16 NOTE — Progress Notes (Signed)
Patient discharged via ambulance [facility].

## 2015-02-16 NOTE — Progress Notes (Signed)
Physical Therapy Treatment Patient Details Name: Bill [name] MRN: [id number] DOB: 07-25-1956 Today's Date: 02/16/2015    History of Present Illness Pt is s/p R direct anterior THA    PT Comments    Pt motivated and progressing with mobility.  Ltd this date by onset of dizziness with ambulation - BP 87/50 - RN aware  Follow Up Recommendations  SNF     Equipment Recommendations  Rolling walker with 5" wheels    Recommendations for Other Services OT consult     Precautions / Restrictions Precautions Precautions: Fall Restrictions Weight Bearing Restrictions: No Other Position/Activity Restrictions: WBAT    Mobility  Bed Mobility Overal bed mobility: Needs Assistance Bed Mobility: Supine to Sit     Supine to sit: Min assist     General bed mobility comments: cues for sequence and use of L LE to self assist  Transfers Overall transfer level: Needs assistance Equipment used: Rolling walker (2 wheeled) Transfers: Sit to/from Stand Sit to Stand: Min guard;From elevated surface         General transfer comment: verbal cues for hand placement and LE management.   Ambulation/Gait Ambulation/Gait assistance: Min assist Ambulation Distance (Feet): 260 Feet Assistive device: Rolling walker (2 wheeled) Gait Pattern/deviations: Step-to pattern;Step-through pattern;Decreased step length - right;Decreased step length - left;Shuffle;Trunk flexed Gait velocity: decr   General Gait Details: cues for posture, sequence, position from RW and ER on R   Stairs            Wheelchair Mobility    Modified Rankin (Stroke Patients Only)       Balance                                    Cognition Arousal/Alertness: Awake/alert Behavior During Therapy: WFL for tasks assessed/performed Overall Cognitive Status: Within Functional Limits for tasks assessed                      Exercises Total Joint Exercises Ankle Circles/Pumps: AROM;Both;15  reps;Supine Quad Sets: AROM;Both;10 reps;Supine Gluteal Sets: AROM;Both;10 reps;Supine Heel Slides: AAROM;Right;20 reps;Supine Hip ABduction/ADduction: AAROM;Right;Supine;15 reps Long Arc Quad: AAROM;AROM;Both;15 reps;Supine    General Comments        Pertinent Vitals/Pain Pain Assessment: 0-10 Pain Score: 3  Pain Location: R hip Pain Descriptors / Indicators: Aching;Sore Pain Intervention(s): Monitored during session;Limited activity within patient's tolerance;Premedicated before session;Ice applied    Home Living                      Prior Function            PT Goals (current goals can now be found in the care plan section) Acute Rehab PT Goals Patient Stated Goal: go to rehab to increase independence PT Goal Formulation: With patient Time For Goal Achievement: 02/22/15 Potential to Achieve Goals: Good Progress towards PT goals: Progressing toward goals    Frequency  7X/week    PT Plan Current plan remains appropriate    Co-evaluation             End of Session Equipment Utilized During Treatment: Gait belt Activity Tolerance: Patient tolerated treatment well;Other (comment) (dizzy) Patient left: in chair;with call bell/phone within reach;with nursing/sitter in room     Time: 5784-6962 PT Time Calculation (min) (ACUTE ONLY): 43 min  Charges:  $Gait Training: 23-37 mins $Therapeutic Exercise: 8-22 mins  G Codes:      Kathlyne Loud 02/16/2015, 12:26 PM

## 2015-02-18 ENCOUNTER — Non-Acute Institutional Stay (SKILLED_NURSING_FACILITY): Payer: BLUE CROSS/BLUE SHIELD | Admitting: Internal Medicine

## 2015-02-18 ENCOUNTER — Other Ambulatory Visit: Payer: Self-pay | Admitting: *Deleted

## 2015-02-18 DIAGNOSIS — M1611 Unilateral primary osteoarthritis, right hip: Secondary | ICD-10-CM

## 2015-02-18 DIAGNOSIS — G4730 Sleep apnea, unspecified: Secondary | ICD-10-CM

## 2015-02-18 DIAGNOSIS — K5901 Slow transit constipation: Secondary | ICD-10-CM

## 2015-02-18 DIAGNOSIS — D72829 Elevated white blood cell count, unspecified: Secondary | ICD-10-CM

## 2015-02-18 MED ORDER — OXYCODONE HCL 5 MG PO TABS: ORAL_TABLET | ORAL | Status: DC

## 2015-02-18 NOTE — Telephone Encounter (Signed)
[facility]

## 2015-02-18 NOTE — Progress Notes (Addendum)
Patient ID: Bill [name], male   DOB: 1956/01/14, 59 y.o.   MRN: [id number]     Facility: [facility]    PCP: Sissy Hoff, MD  Code Status: full code  No Known Allergies  Chief Complaint  Patient presents with  . New Admit To SNF     HPI:  59 year old patient is here for short term rehabilitation post hospital admission from 02/14/15-02/15/15 with right hip osteoarthritis. He underwent right right total hip arthroplasty. He is seen in his room today. His pain is under control with current regimen, bowel movement are regular and has been working with therapy team. He has soreness and weakness/ tiredness post therapy. He denies any concerns this visit. No new concern from staff.  Review of Systems:  Constitutional: Negative for fever, chills, diaphoresis.  HENT: Negative for headache, congestion, nasal discharge Eyes: Negative for eye pain, blurred vision, double vision and discharge.  Respiratory: Negative for cough, shortness of breath and wheezing.   Cardiovascular: Negative for chest pain, palpitations, leg swelling.  Gastrointestinal: Negative for heartburn, nausea, vomiting, abdominal pain, melena, rectal bleed, diarrhea and constipation. appetite is good Genitourinary: Negative for dysuria, flank pain.  Musculoskeletal: Negative for back pain, falls Skin: Negative for itching, rash.  Neurological: Negative for dizziness, tingling, focal weakness Psychiatric/Behavioral: Negative for depression, anxiety, insomnia and memory loss.    Past Medical History  Diagnosis Date  . Sleep apnea     uses CPAP  . Arthritis   . Anginal pain     hx of noted ER visit in 2001 pt had CXR preformed 01/30/2000 discussed CP issues pt states never had to followup with cardiologist denies further issues    Past Surgical History  Procedure Laterality Date  . Broken left foot       1988 secondary to fall had to have surgical intervention has hardware  . Nasal septum  surgery    . Colonscopy     . Total hip arthroplasty Right 02/14/2015    Procedure: RIGHT TOTAL HIP ARTHROPLASTY ANTERIOR APPROACH;  Surgeon: Loanne Drilling, MD;  Location: [facility];  Service: Orthopedics;  Laterality: Right;   Social History:   reports that he has never smoked. He has never used smokeless tobacco. He reports that he does not drink alcohol or use illicit drugs.  No family history on file.  Medications: Patient's Medications  New Prescriptions   No medications on file  Previous Medications   ACETAMINOPHEN (TYLENOL) 325 MG TABLET    Take 2 tablets (650 mg total) by mouth every 6 (six) hours as needed for mild pain (or Fever >/= 101).   BISACODYL (DULCOLAX) 10 MG SUPPOSITORY    Place 1 suppository (10 mg total) rectally daily as needed for moderate constipation.   DOCUSATE SODIUM (COLACE) 100 MG CAPSULE    Take 1 capsule (100 mg total) by mouth 2 (two) times daily.   METHOCARBAMOL (ROBAXIN) 500 MG TABLET    Take 1 tablet (500 mg total) by mouth every 6 (six) hours as needed for muscle spasms.   METOCLOPRAMIDE (REGLAN) 5 MG TABLET    Take 1 tablet (5 mg total) by mouth every 8 (eight) hours as needed for nausea (if ondansetron (ZOFRAN) ineffective.).   ONDANSETRON (ZOFRAN) 4 MG TABLET    Take 1 tablet (4 mg total) by mouth every 6 (six) hours as needed for nausea.   OXYCODONE (OXY IR/ROXICODONE) 5 MG IMMEDIATE RELEASE TABLET    Take one tablet by  mouth every 3 hours as needed for moderate pain; Take two tablets by mouth every 3 hours as needed for severe breakthrough pain   POLYETHYLENE GLYCOL (MIRALAX / GLYCOLAX) PACKET    Take 17 g by mouth daily as needed for mild constipation.   RIVAROXABAN (XARELTO) 10 MG TABS TABLET    Take 1 tablet (10 mg total) by mouth daily with breakfast. Take Xarelto for two and a half more weeks, then discontinue Xarelto. Once the patient has completed the blood thinner regimen, then take a Baby 81 mg Aspirin daily for three more weeks.   TRAMADOL  (ULTRAM) 50 MG TABLET    Take 1-2 tablets (50-100 mg total) by mouth every 6 (six) hours as needed (mild pain).  Modified Medications   No medications on file  Discontinued Medications   No medications on file     Physical Exam: Filed Vitals:   02/18/15 1152  BP: 117/76  Pulse: 88  Temp: 98.1 F (36.7 C)  Resp: 18  SpO2: 96%    General- adult male, well built, in no acute distress Head- normocephalic, atraumatic Throat- moist mucus membrane Eyes- PERRLA, EOMI, no pallor, no icterus, no discharge, normal conjunctiva, normal sclera Neck- no cervical lymphadenopathy Cardiovascular- normal s1,s2, no murmurs, palpable dorsalis pedis and radial pulses, no leg edema Respiratory- bilateral clear to auscultation, no wheeze, no rhonchi, no crackles, no use of accessory muscles Abdomen- bowel sounds present, soft, non tender Musculoskeletal- able to move all 4 extremities, right leg rom limited at hip Neurological- no focal deficit Skin- warm and dry, surgical incision in right hip area with steristrips, healing well, no signs of infection Psychiatry- alert and oriented to person, place and time, normal mood and affect    Labs reviewed: Basic Metabolic Panel:  Recent Labs  19/14/7801/12/12 0854 02/15/15 0440 02/16/15 0500  NA 136 138 133*  K 3.9 4.1 4.0  CL 98 106 99  CO2 30 25 27   GLUCOSE 88 151* 110*  BUN 20 12 16   CREATININE 0.73 0.64 0.89  CALCIUM 9.7 8.8 8.7   Liver Function Tests:  Recent Labs  02/08/15 0854  AST 23  ALT 15  ALKPHOS 67  BILITOT 0.9  PROT 7.8  ALBUMIN 4.6   No results for input(s): LIPASE, AMYLASE in the last 8760 hours. No results for input(s): AMMONIA in the last 8760 hours. CBC:  Recent Labs  02/08/15 0854 02/15/15 0440 02/16/15 0500  WBC 7.5 14.9* 15.0*  HGB 17.1* 15.8 15.6  HCT 50.4 46.2 45.9  MCV 92.8 91.5 92.0  PLT 304 227 224    Assessment/Plan  Right hip OA S/p right hip arthroplasty. Will have him work with physical  therapy and occupational therapy team to help with gait training and muscle strengthening exercises.fall precautions. Skin care. Encourage to be out of bed. Has f/u with orthopedics. Continue robaxin 500 mg q6h prn for muscle spasm and tramadol and oxyIR prn for pain. Continue xarelto for dvt prophylaxis.   Constipation Stable. Continue colace bid and dulcolax prn for bowel movement. Also on prn miralax. Monitor clinically.  Sleep apnea Continue using CPAP machine at night    Goals of care: short term rehabilitation   Labs/tests ordered: cbc with diff  Family/ staff Communication: reviewed care plan with patient and nursing supervisor    Oneal GroutMAHIMA Mikaylah Libbey, MD  [facility] [phone] (Monday-Friday 8 am - 5 pm) [phone] (afterhours)  Leukocytosis- on lab review on discharge from hospital. Pt is afebrile. No clinical signs of  infection. Will recheck cbc with diff. Monitor clinically

## 2015-02-19 LAB — CBC AND DIFFERENTIAL
HCT: 43 % (ref 41–53)
Hemoglobin: 15.3 g/dL (ref 13.5–17.5)
Platelets: 258 10*3/uL (ref 150–399)
WBC: 8.2 10*3/mL

## 2015-02-20 ENCOUNTER — Encounter: Payer: Self-pay | Admitting: Registered Nurse

## 2015-02-20 ENCOUNTER — Non-Acute Institutional Stay (SKILLED_NURSING_FACILITY): Payer: BLUE CROSS/BLUE SHIELD | Admitting: Registered Nurse

## 2015-02-20 DIAGNOSIS — K5901 Slow transit constipation: Secondary | ICD-10-CM

## 2015-02-20 DIAGNOSIS — G4730 Sleep apnea, unspecified: Secondary | ICD-10-CM

## 2015-02-20 DIAGNOSIS — M1611 Unilateral primary osteoarthritis, right hip: Secondary | ICD-10-CM

## 2015-02-20 DIAGNOSIS — E871 Hypo-osmolality and hyponatremia: Secondary | ICD-10-CM

## 2015-02-20 NOTE — Progress Notes (Signed)
Patient ID: Bill [name], male   DOB: 05-Oct-1956, 59 y.o.   MRN: [id number]   Place of Service: [facility]  No Known Allergies  Code Status: Full Code  Goals of Care: Longevity/STR  Chief Complaint  Patient presents with  . Discharge Note    HPI 59 y.o. male with PMH of OA, OSA, and others is being seen for a discharge visit. Patient was here for short-term rehabilitation post hospital admission from 02/14/15 to 02/15/15 with right hip osteoarthritis s/p right right total hip arthroplasty. Patient has worked with therapy team and is ready to be discharged home with [facility] PT/OT, medications, and DME (FWW). Seen in room today. Reported having soreness of right hip after therapy. Otherwise denies any concerns.   Review of Systems Constitutional: Negative for fever, chills, and fatigue. HENT: Negative for ear pain, congestion, and sore throat Eyes: Negative for eye pain, eye discharge, and visual disturbance  Cardiovascular: Negative for chest pain, palpitations, and leg swelling Respiratory: Negative cough, shortness of breath, and wheezing.  Gastrointestinal: Negative for nausea and vomiting. Negative for abdominal pain, diarrhea and constipation.  Genitourinary: Negative for  dysuria, frequency, urgency, and hematuria Endocrine: Negative for polydipsia, polyphagia, and polyuria Musculoskeletal: Negative for back pain. Positive for right hip pain post op Neurological: Negative for dizziness, headache, weakness, and tremors.  Skin: Negative for rash and wound.   Psychiatric: Negative for depression   Past Medical History  Diagnosis Date  . Sleep apnea     uses CPAP  . Arthritis   . Anginal pain     hx of noted ER visit in 2001 pt had CXR preformed 01/30/2000 discussed CP issues pt states never had to followup with cardiologist denies further issues     Past Surgical History  Procedure Laterality Date  . Broken left foot       1988 secondary to fall had to have surgical  intervention has hardware  . Nasal septum surgery    . Colonscopy     . Total hip arthroplasty Right 02/14/2015    Procedure: RIGHT TOTAL HIP ARTHROPLASTY ANTERIOR APPROACH;  Surgeon: Loanne Drilling, MD;  Location: [facility];  Service: Orthopedics;  Laterality: Right;    History   Social History  . Marital Status: Single    Spouse Name: N/A  . Number of Children: N/A  . Years of Education: N/A   Occupational History  . Not on file.   Social History Main Topics  . Smoking status: Never Smoker   . Smokeless tobacco: Never Used  . Alcohol Use: No  . Drug Use: No  . Sexual Activity: Not on file   Other Topics Concern  . Not on file   Social History Narrative    No family history on file.    Medication List       This list is accurate as of: 02/20/15  2:20 PM.  Always use your most recent med list.               acetaminophen 325 MG tablet  Commonly known as:  TYLENOL  Take 2 tablets (650 mg total) by mouth every 6 (six) hours as needed for mild pain (or Fever >/= 101).     bisacodyl 10 MG suppository  Commonly known as:  DULCOLAX  Place 1 suppository (10 mg total) rectally daily as needed for moderate constipation.     docusate sodium 100 MG capsule  Commonly known as:  COLACE  Take 1 capsule (  100 mg total) by mouth 2 (two) times daily.     methocarbamol 500 MG tablet  Commonly known as:  ROBAXIN  Take 1 tablet (500 mg total) by mouth every 6 (six) hours as needed for muscle spasms.     oxyCODONE 5 MG immediate release tablet  Commonly known as:  Oxy IR/ROXICODONE  Take one tablet by mouth every 3 hours as needed for moderate pain; Take two tablets by mouth every 3 hours as needed for severe breakthrough pain     polyethylene glycol packet  Commonly known as:  MIRALAX / GLYCOLAX  Take 17 g by mouth daily as needed for mild constipation.     rivaroxaban 10 MG Tabs tablet  Commonly known as:  XARELTO  - Take 1 tablet (10 mg total) by mouth daily with  breakfast. Take Xarelto for two and a half more weeks, then discontinue Xarelto.  - Once the patient has completed the blood thinner regimen, then take a Baby 81 mg Aspirin daily for three more weeks.     traMADol 50 MG tablet  Commonly known as:  ULTRAM  Take 1-2 tablets (50-100 mg total) by mouth every 6 (six) hours as needed (mild pain).        Physical Exam  BP 107/72 mmHg  Pulse 96  Temp(Src) 97 F (36.1 C)  Resp 20  Ht 6' (1.829 m)  Wt 185 lb 12.8 oz (84.278 kg)  BMI 25.19 kg/m2  Constitutional: WDWN adult  male in no acute distress. Conversant and pleasant HEENT: Normocephalic and atraumatic. PERRL. EOM intact. No icterus.  No nasal discharge or sinus tenderness. Oral mucosa moist. Posterior pharynx clear of any exudate or lesions.  Neck: Supple and nontender. No lymphadenopathy, masses, or thyromegaly. No JVD or carotid bruits. Cardiac: Normal S1, S2. RRR without appreciable murmurs, rubs, or gallops. Distal pulses intact. Trace dependent edema.  Lungs: No respiratory distress. Breath sounds clear bilaterally without rales, rhonchi, or wheezes. Abdomen: Audible bowel sounds in all quadrants. Soft, nontender, nondistended.  Musculoskeletal: able to move all extremities. Ambulates using FWW. Right hip surgical incision w/o signs of infection.   Skin: Warm and dry. No rash noted. Neurological: Alert and oriented to person, place, and time. No focal deficits. Psychiatric: Judgment and insight adequate. Appropriate mood and affect.   Labs Reviewed  CBC Latest Ref Rng 02/19/2015 02/16/2015 02/15/2015  WBC - 8.2 15.0(H) 14.9(H)  Hemoglobin 13.5 - 17.5 g/dL 09.815.3 11.915.6 14.715.8  Hematocrit 41 - 53 % 43 45.9 46.2  Platelets 150 - 399 K/L 258 224 227    CMP Latest Ref Rng 02/16/2015 02/15/2015 02/08/2015  Glucose 70 - 99 mg/dL 829(F110(H) 621(H151(H) 88  BUN 6 - 23 mg/dL 16 12 20   Creatinine 0.50 - 1.35 mg/dL 0.860.89 5.780.64 4.690.73  Sodium 135 - 145 mmol/L 133(L) 138 136  Potassium 3.5 - 5.1 mmol/L  4.0 4.1 3.9  Chloride 96 - 112 mmol/L 99 106 98  CO2 19 - 32 mmol/L 27 25 30   Calcium 8.4 - 10.5 mg/dL 8.7 8.8 9.7  Total Protein 6.0 - 8.3 g/dL - - 7.8  Total Bilirubin 0.3 - 1.2 mg/dL - - 0.9  Alkaline Phos 39 - 117 U/L - - 67  AST 0 - 37 U/L - - 23  ALT 0 - 53 U/L - - 15    Assessment & Plan 1. Primary osteoarthritis of right hip S/p right total hip arthroplasty. Continue HH PT/OT for gait/strength/balance training to restore/maximize function. Continue tramadol 1-2 tabs  every six hours as needed and oxycodone  1-2tabs every 3 hours as needed pain with robaxin  every six hours as needed for muscle spasm. Continue xarelto  daily until 03/06/15  then discontinue Xarelto. Start baby aspirin  daily for 3 more weeks once Xarelto has been completed for DVT prophylaxis. Continue to f/u with ortho.   2. Slow transit constipation No issues. Continue colace  twice daily, miralax 17g daily as needed and dulcolax  suppository as needed for moderate to severe constipation. Encourage hydration and ambulation as tolerated.  3. Sleep apnea Continue cpap at night  4. Hyponatremia Noted on recent lab. Asymptomatic. F/u with pcp   Home health services: PT/OT DME required: [facility] PCP follow-up: please schedule f/u appt with pcp w/in 1-2 of discharge from SNF 30-day supply of prescription medications provided (#30 robaxin , #30 tramadol , #30 oxycodone )  Time spent: 35 minutes on care coordination and counseling   Family/Staff Communication Plan of care discussed with patient and nursing staff. Patient and nursing staff verbalized understanding and agree with plan of care. No additional questions or concerns reported.    Loura Back, MSN, AGNP-C [facility] [address] [place], [place] [address] [phone] [8am-5pm] After hours: [phone]

## 2015-04-16 IMAGING — DX DG PORTABLE PELVIS
1 series · 1 of 1 positions shown · non-contrast
Comparison: None.

CLINICAL DATA: Status post total hip arthroplasty

EXAM:
DG C-ARM 1-60 MIN - NRPT [facility]; PORTABLE PELVIS 1-2 VIEWS

[pelvis ap]
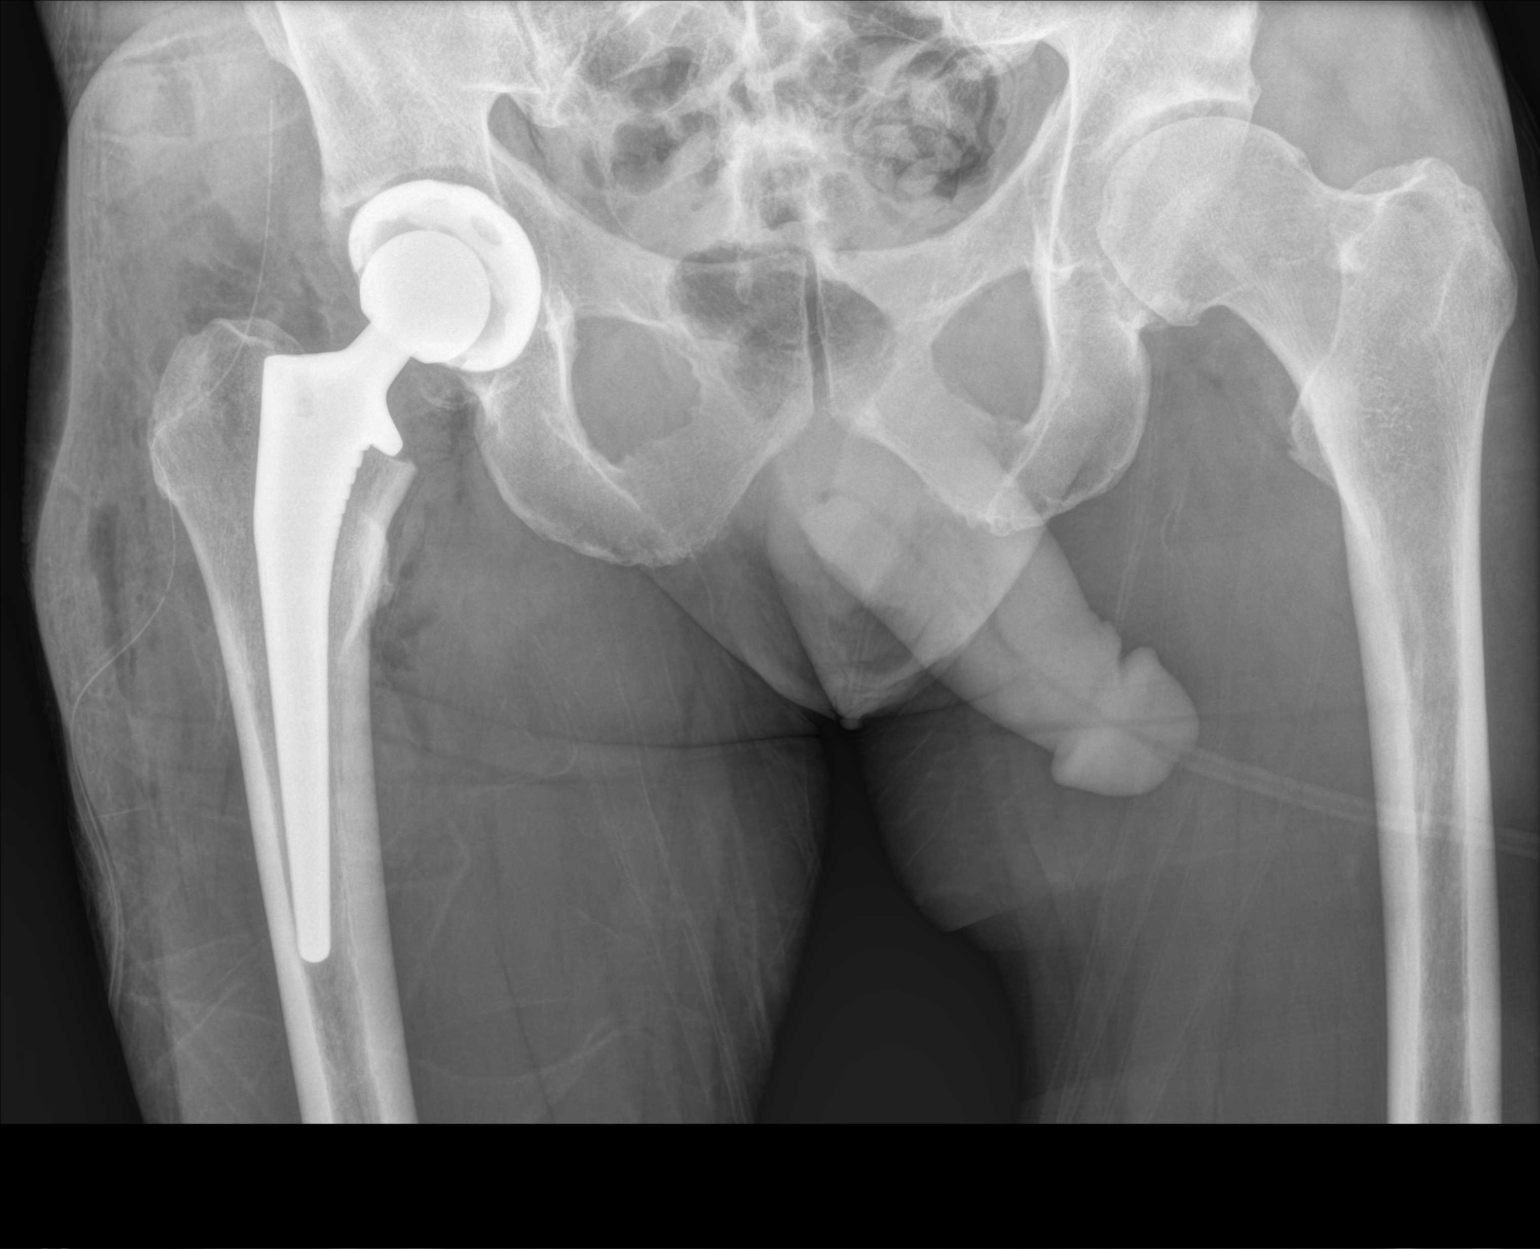

[1 of 1 positions shown; findings below may reference images not displayed]

FLUOROSCOPY TIME:  Fluoroscopy Time:  0 minutes 18 seconds

Number of Acquired Images:  1
FINDINGS: There is a total hip prosthesis on the right which appears well
seated. No fracture or dislocation. There is mild narrowing of the
left hip joint. There is a surgical drain on the right. Soft tissue
air on the right is an expected postoperative finding.
IMPRESSION: Total hip prosthesis on the right appears well-seated. No acute
fracture or dislocation.

## 2015-12-09 ENCOUNTER — Other Ambulatory Visit: Payer: Self-pay | Admitting: Family Medicine

## 2015-12-09 ENCOUNTER — Ambulatory Visit
Admission: RE | Admit: 2015-12-09 | Discharge: 2015-12-09 | Disposition: A | Discharge status: Home / Self Care | Payer: BLUE CROSS/BLUE SHIELD | Source: Ambulatory Visit | Attending: Family Medicine | Admitting: Family Medicine

## 2015-12-09 DIAGNOSIS — Z77090 Contact with and (suspected) exposure to asbestos: Secondary | ICD-10-CM

## 2016-02-08 IMAGING — CR DG CHEST 2V
2 series · 2 of 2 positions shown · non-contrast
Comparison: None in PACs

CLINICAL DATA: Possible occupational asbestos exposure, no current
chest complaints, nonsmoker.

EXAM:
CHEST  2 VIEW

[w chest pa]
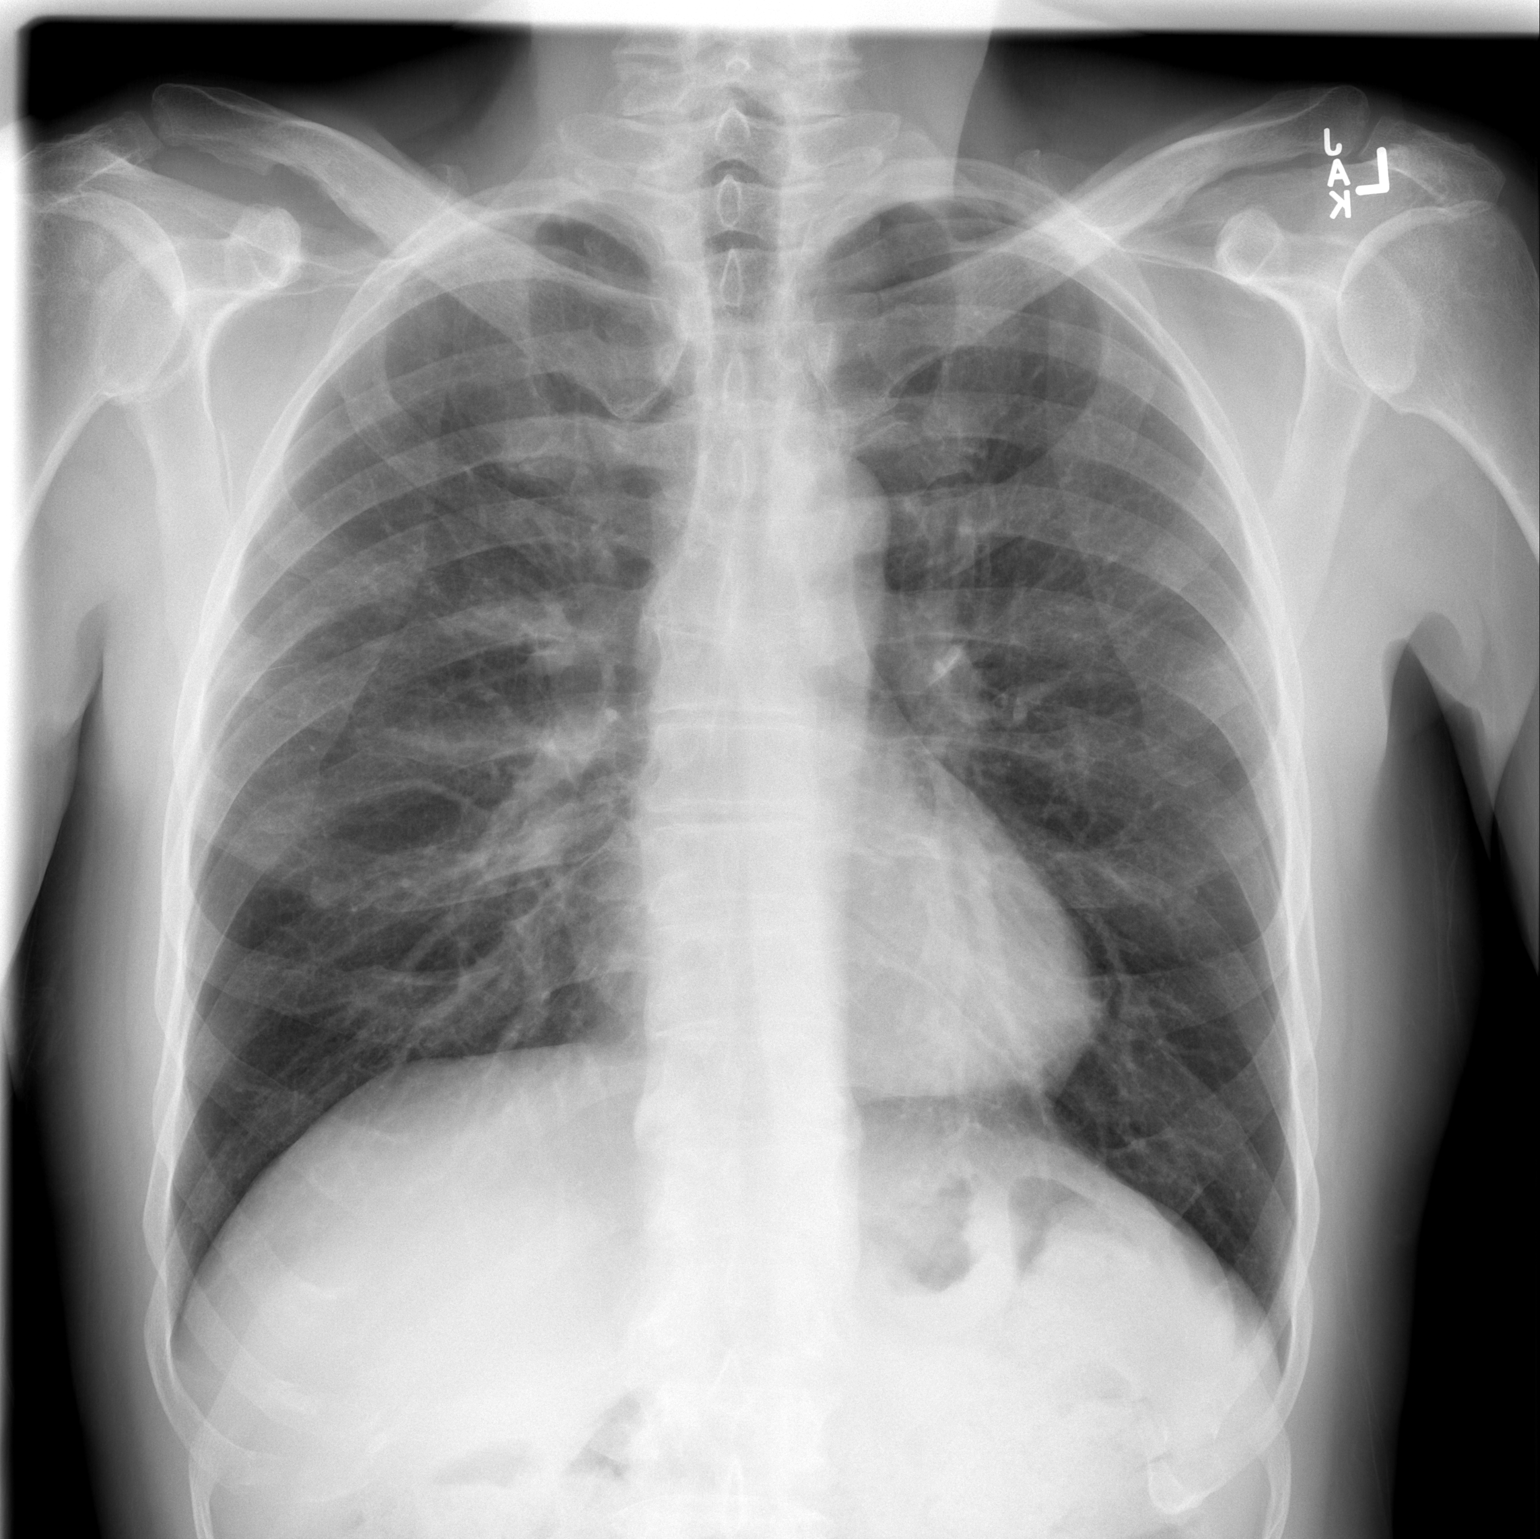

[w chest lat]
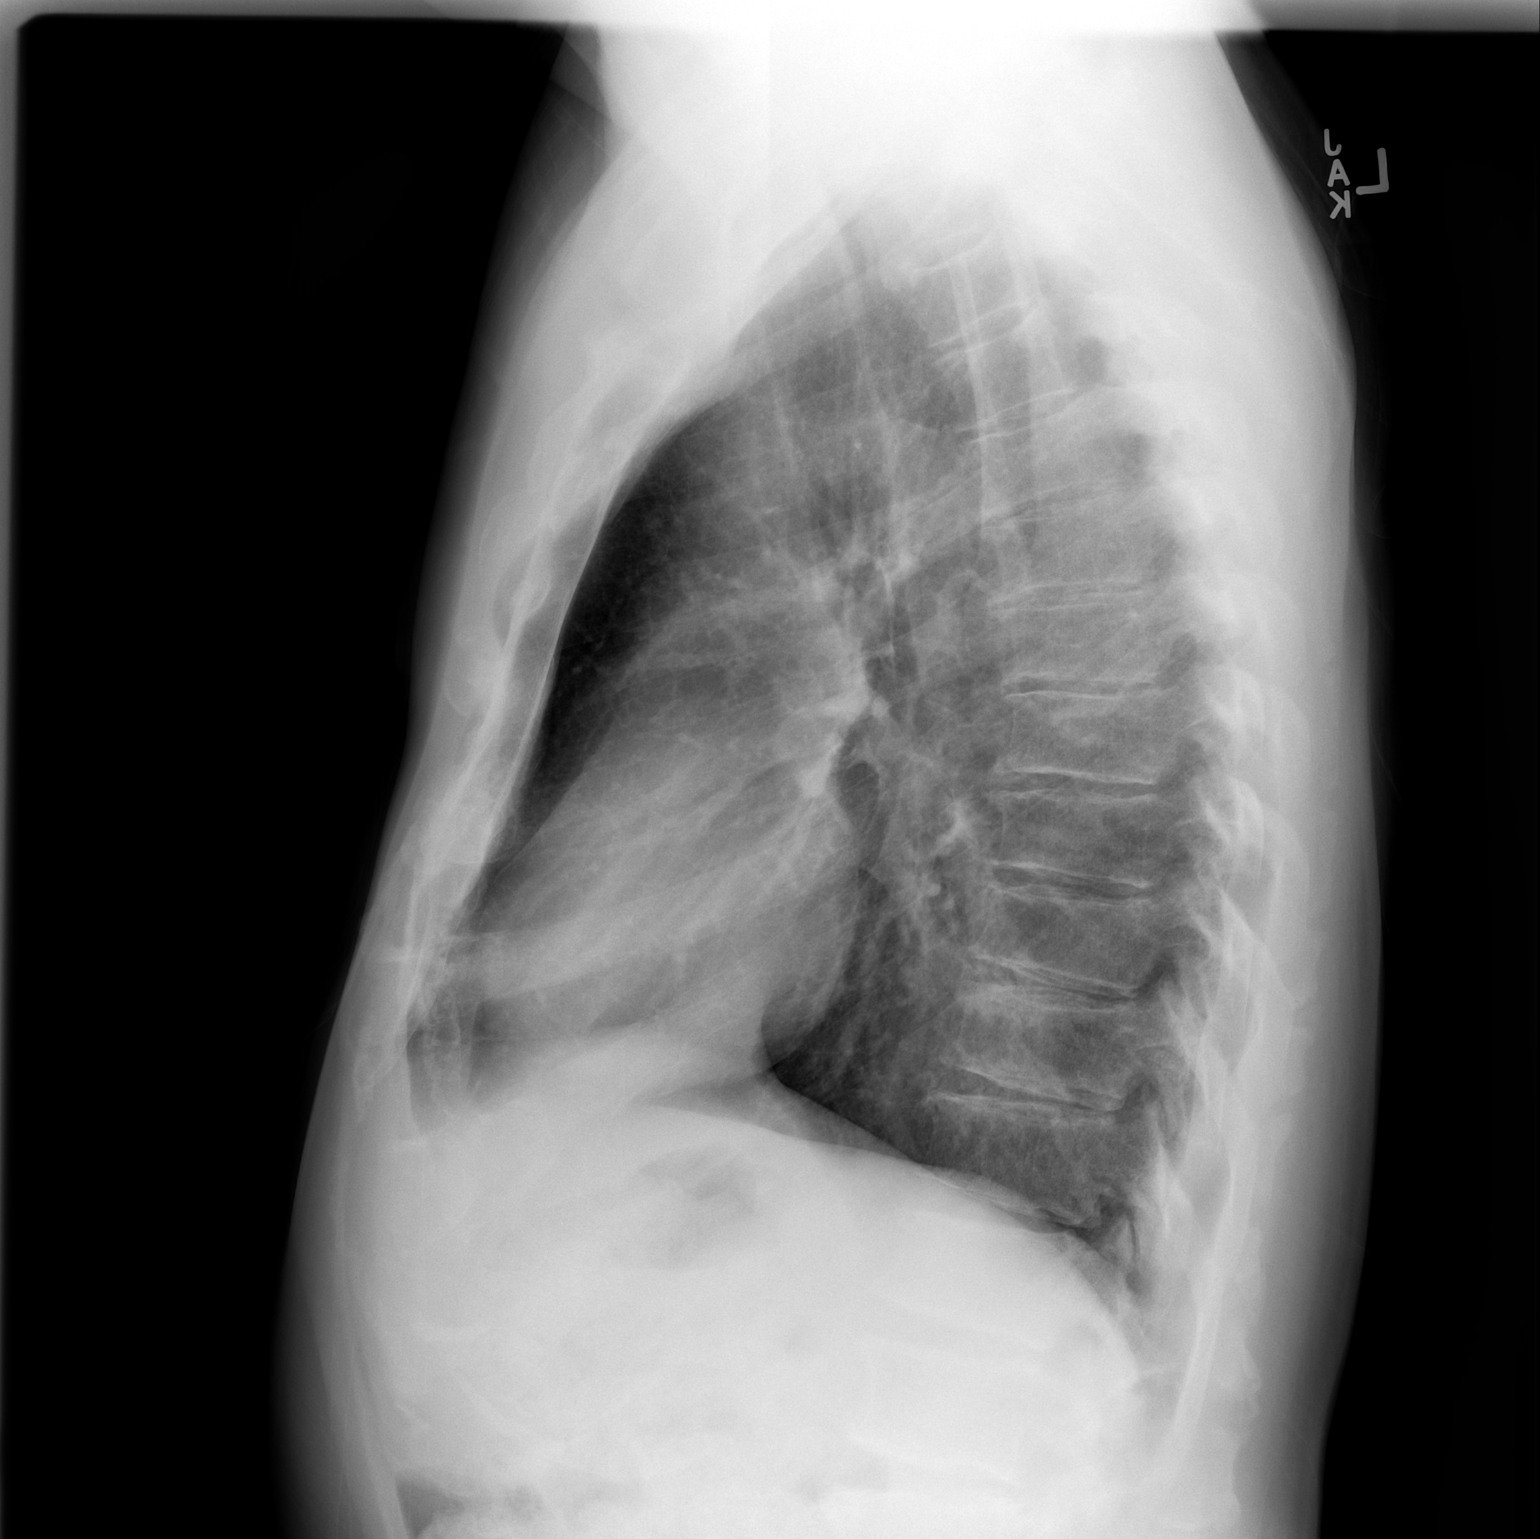

[2 of 2 positions shown; findings below may reference images not displayed]

FINDINGS: The lungs are well-expanded and clear. The heart and pulmonary
vascularity are normal. The mediastinum is normal in width. There is
no pleural effusion. No pleural plaques are demonstrated. There is
mild multilevel degenerative disc disease of the thoracic spine.
IMPRESSION: There are no changes suspicious for previous asbestos exposure. No
active cardiopulmonary disease is observed.

## 2017-06-25 ENCOUNTER — Ambulatory Visit: Payer: Self-pay | Admitting: Orthopedic Surgery

## 2017-06-25 NOTE — H&P (Signed)
Bill [name] DOB: 03/19/1956 Single / Language: English / Race: White Male Date of Admission:  07/19/2017 CC:  Right knee pain History of Present Illness The patient is a 60 year old male who comes in for a preoperative History and Physical. The patient is scheduled for a right total knee arthroplasty to be performed by Dr. Frank V. Aluisio, MD at [facility] on 07/19/2017. The patient is a 60 year old male who presented for follow up of their knee. The patient is being followed for their right knee pain and osteoarthritis. They are now months out from Hyalgan series. Symptoms reported include: pain, aching, catching, giving way, instability and difficulty ambulating. The patient feels that they are doing poorly (worsened in the past couple months) and report their pain level to be mild to moderate. Current treatment includes: activity modification. The following medication has been used for pain control: antiinflammatory medication (Aleve). The patient has not gotten any relief of their symptoms with viscosupplementation (helped some for only a short period). Unfortunately, his knee is getting progressively worse. The valgus deformity is worsening. The knee is becoming more unstable. He had the Hyalgan injections, but this time they did not help. They were working for several years but they stopped working. He is at a stage where the knee is making it more difficult to do regular activities and to work. He is ready to go ahead and get it fixed. They have been treated conservatively in the past for the above stated problem and despite conservative measures, they continue to have progressive pain and severe functional limitations and dysfunction. They have failed non-operative management including home exercise, medications, and injections. It is felt that they would benefit from undergoing total joint replacement. Risks and benefits of the procedure have been discussed with the patient and they elect  to proceed with surgery. There are no active contraindications to surgery such as ongoing infection or rapidly progressive neurological disease.  Problem List/Past Medical  Sleep Apnea  Lumbar spine pain (M54.5)  Traumatic rotator cuff tear, left, subsequent encounter (S46.012D)  Impingement syndrome of left shoulder (M75.42)  Status post right hip replacement (Z96.641)  Primary osteoarthritis of left hip (M16.12)  Chronic pain of both shoulders (M25.511, M25.512)  Heterotopic ossification (M89.8X9)  Hammertoe of left foot (M20.42)  Primary osteoarthritis of right knee (M17.11)  Post-traumatic osteoarthritis of left ankle (M19.172)  Claw toe, left (M20.5X2)  Chronic pain of left ankle (M25.572)  DDD (degenerative disc disease), lumbosacral (M51.37)  Tinnitus   Allergies  No Known Drug Allergies   Family History First Degree Relatives  reported No pertinent family history   Social History Children  0 Tobacco use  Never smoker. 03/07/2014 Current work status  working full time Exercise  Exercises daily; does running / walking Living situation  live alone Marital status  single Never consumed alcohol  03/07/2014: Never consumed alcohol No history of drug/alcohol rehab  Not under pain contract  Number of flights of stairs before winded  greater than 5 Tobacco / smoke exposure  03/07/2014: no Post-Surgical Plans  [facility] Advance Directives  Living Will, Healthcare POA  Medication History TraMADol HCl (50MG Tablet, 1 (one) Tablet Tablet Oral po q6-8h prn pain, Taken starting 08/12/2016) Active. Glucosamine Chondroitin Adv (Oral) Specific strength unknown - Active. Naproxen (Oral) Specific strength unknown - Active. Tylenol Extra Strength (500MG Tablet, Oral as needed) Active. Testosterone (Intramuscular) Specific strength unknown - Active.  Past Surgical History Ankle Surgery  left Straighten   Nasal Septum    Review  of Systems General Not Present- Chills, Fatigue, Fever, Memory Loss, Night Sweats, Weight Gain and Weight Loss. Skin Not Present- Eczema, Hives, Itching, Lesions and Rash. HEENT Not Present- Dentures, Double Vision, Headache, Hearing Loss, Tinnitus and Visual Loss. Respiratory Not Present- Allergies, Chronic Cough, Coughing up blood, Shortness of breath at rest and Shortness of breath with exertion. Cardiovascular Not Present- Chest Pain, Difficulty Breathing Lying Down, Murmur, Palpitations, Racing/skipping heartbeats and Swelling. Gastrointestinal Not Present- Abdominal Pain, Bloody Stool, Constipation, Diarrhea, Difficulty Swallowing, Heartburn, Jaundice, Loss of appetitie, Nausea and Vomiting. Male Genitourinary Not Present- Blood in Urine, Discharge, Flank Pain, Incontinence, Painful Urination, Urgency, Urinary frequency, Urinary Retention, Urinating at Night and Weak urinary stream. Musculoskeletal Present- Joint Pain. Not Present- Back Pain, Joint Swelling, Morning Stiffness, Muscle Pain, Muscle Weakness and Spasms. Neurological Not Present- Blackout spells, Difficulty with balance, Dizziness, Paralysis, Tremor and Weakness. Psychiatric Not Present- Insomnia.   Physical Exam General Mental Status -Alert, cooperative and good historian. General Appearance-pleasant, Not in acute distress. Orientation-Oriented X3. Build & Nutrition-Well nourished and Well developed. Note: Tall, thin framed  Head and Neck Head-normocephalic, atraumatic . Neck Global Assessment - supple, no bruit auscultated on the right, no bruit auscultated on the left.  Eye Vision-Wears corrective lenses. Pupil - Bilateral-Regular and Round. Motion - Bilateral-EOMI.  Chest and Lung Exam Auscultation Breath sounds - clear at anterior chest wall and clear at posterior chest wall. Adventitious sounds - No Adventitious sounds.  Cardiovascular Auscultation Rhythm - Regular rate and rhythm. Heart  Sounds - S1 WNL and S2 WNL. Murmurs & Other Heart Sounds - Auscultation of the heart reveals - No Murmurs.  Abdomen Palpation/Percussion Tenderness - Abdomen is non-tender to palpation. Rigidity (guarding) - Abdomen is soft. Auscultation Auscultation of the abdomen reveals - Bowel sounds normal.  Male Genitourinary Note: Not done, not pertinent to present illness   Musculoskeletal Note: On exam, he is in no distress. His right knee shows about a 20-degree valgus deformity which worsens with weightbearing. He has marked crepitus on range of motion. He has no effusion. Range 5 to 120 with no tenderness. He has some correction of the valgus with varus stressing but not full correction.  RADIOGRAPHS Radiographs were reviewed, AP and lateral. He has severe bone-on-bone arthritis of the right knee involving the lateral and patellofemoral compartments.  Assessment & Plan Primary osteoarthritis of right knee (M17.11) Aftercare following right hip joint replacement surgery (Z47.1, Z96.641)  Note:Surgical Plans: Right Total Knee Replacement  Disposition: [facility] [place] [facility]  PCP: Dr. David Swayne - Patient has been seen preoperatively and felt to be stable for surgery.  IV TXA  Anesthesia Issues: None  Patient was instructed on what medications to stop prior to surgery.  Signed electronically by Iantha Titsworth L Roberts Bon, III PA-C  

## 2017-06-25 NOTE — H&P (Signed)
Bill BaileyJerry W [name] DOB: 12/27/1956 Single / Language: [place] / Race: White Male Date of Admission:  07/19/2017 CC:  Right knee pain History of Present Illness The patient is a 61 year old male who comes in for a preoperative History and Physical. The patient is scheduled for a right total knee arthroplasty to be performed by Dr. Gus RankinFrank V. Aluisio, MD [facility] 07/[facility]/2017. The patient is a 61 year old male who presented for follow up of their knee. The patient is being followed for their right knee pain and osteoarthritis. They are now months out from Hyalgan series. Symptoms reported include: pain, aching, catching, giving way, instability and difficulty ambulating. The patient feels that they are doing poorly (worsened in the past couple months) and report their pain level to be mild to moderate. Current treatment includes: activity modification. The following medication has been used for pain control: antiinflammatory medication (Aleve). The patient has not gotten any relief of their symptoms with viscosupplementation (helped some for only a short period). Unfortunately, his knee is getting progressively worse. The valgus deformity is worsening. The knee is becoming more unstable. He had the Hyalgan injections, but this time they did not help. They were working for several years but they stopped working. He is at a stage where the knee is making it more difficult to do regular activities and to work. He is ready to go ahead and get it fixed. They have been treated conservatively in the past for the above stated problem and despite conservative measures, they continue to have progressive pain and severe functional limitations and dysfunction. They have failed non-operative management including home exercise, medications, and injections. It is felt that they would benefit from undergoing total joint replacement. Risks and benefits of the procedure have been discussed with the patient and they elect  to proceed with surgery. There are no active contraindications to surgery such as ongoing infection or rapidly progressive neurological disease.  Problem List/Past Medical  Sleep Apnea  Lumbar spine pain (M54.5)  Traumatic rotator cuff tear, left, subsequent encounter (W09.811B(S46.012D)  Impingement syndrome of left shoulder (M75.42)  Status post right hip replacement (J47.829(Z96.641)  Primary osteoarthritis of left hip (M16.12)  Chronic pain of both shoulders (M25.511, M25.512)  Heterotopic ossification (M89.8X9)  Hammertoe of left foot (M20.42)  Primary osteoarthritis of right knee (M17.11)  Post-traumatic osteoarthritis of left ankle (M19.172)  Claw toe, left (M20.5X2)  Chronic pain of left ankle (M25.572)  DDD (degenerative disc disease), lumbosacral (M51.37)  Tinnitus   Allergies  No Known Drug Allergies   Family History First Degree Relatives  reported No pertinent family history   Social History Children  0 Tobacco use  Never smoker. 03/07/2014 Current work status  working full time Exercise  Exercises daily; does running / walking Living situation  live alone Marital status  single Never consumed alcohol  03/07/2014: Never consumed alcohol No history of drug/alcohol rehab  Not under pain contract  Number of flights of stairs before winded  greater than 5 Tobacco / smoke exposure  03/07/2014: no Post-Surgical Plans  [facility] Advance Directives  Living Will, Healthcare POA  Medication History TraMADol HCl (50MG  Tablet, 1 (one) Tablet Tablet Oral po q6-8h prn pain, Taken starting 08/12/2016) Active. Glucosamine Chondroitin Adv (Oral) Specific strength unknown - Active. Naproxen (Oral) Specific strength unknown - Active. Tylenol Extra Strength (500MG  Tablet, Oral as needed) Active. Testosterone (Intramuscular) Specific strength unknown - Active.  Past Surgical History Ankle Surgery  left Straighten  Nasal Septum    Review  of Systems General Not Present- Chills, Fatigue, Fever, Memory Loss, Night Sweats, Weight Gain and Weight Loss. Skin Not Present- Eczema, Hives, Itching, Lesions and Rash. HEENT Not Present- Dentures, Double Vision, Headache, Hearing Loss, Tinnitus and Visual Loss. Respiratory Not Present- Allergies, Chronic Cough, Coughing up blood, Shortness of breath at rest and Shortness of breath with exertion. Cardiovascular Not Present- Chest Pain, Difficulty Breathing Lying Down, Murmur, Palpitations, Racing/skipping heartbeats and Swelling. Gastrointestinal Not Present- Abdominal Pain, Bloody Stool, Constipation, Diarrhea, Difficulty Swallowing, Heartburn, Jaundice, Loss of appetitie, Nausea and Vomiting. Male Genitourinary Not Present- Blood in Urine, Discharge, Flank Pain, Incontinence, Painful Urination, Urgency, Urinary frequency, Urinary Retention, Urinating at Night and Weak urinary stream. Musculoskeletal Present- Joint Pain. Not Present- Back Pain, Joint Swelling, Morning Stiffness, Muscle Pain, Muscle Weakness and Spasms. Neurological Not Present- Blackout spells, Difficulty with balance, Dizziness, Paralysis, Tremor and Weakness. Psychiatric Not Present- Insomnia.   Physical Exam General Mental Status -Alert, cooperative and good historian. General Appearance-pleasant, Not in acute distress. Orientation-Oriented X3. Build & Nutrition-Well nourished and Well developed. Note: Tall, thin framed  Head and Neck Head-normocephalic, atraumatic . Neck Global Assessment - supple, no bruit auscultated on the right, no bruit auscultated on the left.  Eye Vision-Wears corrective lenses. Pupil - Bilateral-Regular and Round. Motion - Bilateral-EOMI.  Chest and Lung Exam Auscultation Breath sounds - clear at anterior chest wall and clear at posterior chest wall. Adventitious sounds - No Adventitious sounds.  Cardiovascular Auscultation Rhythm - Regular rate and rhythm. Heart  Sounds - S1 WNL and S2 WNL. Murmurs & Other Heart Sounds - Auscultation of the heart reveals - No Murmurs.  Abdomen Palpation/Percussion Tenderness - Abdomen is non-tender to palpation. Rigidity (guarding) - Abdomen is soft. Auscultation Auscultation of the abdomen reveals - Bowel sounds normal.  Male Genitourinary Note: Not done, not pertinent to present illness   Musculoskeletal Note: On exam, he is in no distress. His right knee shows about a 20-degree valgus deformity which worsens with weightbearing. He has marked crepitus on range of motion. He has no effusion. Range 5 to 120 with no tenderness. He has some correction of the valgus with varus stressing but not full correction.  RADIOGRAPHS Radiographs were reviewed, AP and lateral. He has severe bone-on-bone arthritis of the right knee involving the lateral and patellofemoral compartments.  Assessment & Plan Primary osteoarthritis of right knee (M17.11) Aftercare following right hip joint replacement surgery (Z47.1, O13.086)  Note:Surgical Plans: Right Total Knee Replacement  Disposition: [facility] [facility]  PCP: Dr. Tally Joe - Patient has been seen preoperatively and felt to be stable for surgery.  IV TXA  Anesthesia Issues: None  Patient was instructed on what medications to stop prior to surgery.  Signed electronically by Lauraine Rinne, III PA-C

## 2017-06-28 ENCOUNTER — Ambulatory Visit: Payer: Self-pay | Admitting: Orthopedic Surgery

## 2017-07-09 NOTE — Patient Instructions (Addendum)
Bill BaileyJerry W Yan  07/09/2017   Your procedure is scheduled on: 07/19/17   Report to [facility] Main  Entrance   Report to admitting at     10:10 AM   Call this number if you have problems the morning of surgery [phone]    Remember: ONLY 1 PERSON MAY GO WITH YOU TO SHORT STAY TO GET  READY MORNING OF YOUR SURGERY.  Do not eat food or drink liquids :After Midnight.     Take these medicines the morning of surgery with A SIP OF WATER: NONE                                You may not have any metal on your body including hair pins and              piercings  Do not wear jewelry, make-up, lotions, powders or perfumes, deodorant                       Men may shave face and neck.   Do not bring valuables to the hospital. [facility] IS NOT             RESPONSIBLE   FOR VALUABLES.  Contacts, dentures or bridgework may not be worn into surgery.  Leave suitcase in the car. After surgery it may be brought to your room.                  Please read over the following fact sheets you were given: _____________________________________________________________________           [facility] - Preparing for [facility] Before surgery, you can play an important role.  Because skin is not sterile, your skin needs to be as free of germs as possible.  You can reduce the number of germs on your skin by washing with CHG (chlorahexidine gluconate) soap before surgery.  CHG is an antiseptic cleaner which kills germs and bonds with the skin to continue killing germs even after washing. Please DO NOT use if you have an allergy to CHG or antibacterial soaps.  If your skin becomes reddened/irritated stop using the CHG and inform your nurse when you arrive at Short Stay. Do not shave (including legs and underarms) for at least 48 hours prior to the first CHG shower.  You may shave your face/neck. Please follow these instructions carefully:  1.  Shower with CHG Soap the night before surgery  and the  morning of Surgery.  2.  If you choose to wash your hair, wash your hair first as usual with your  normal  shampoo.  3.  After you shampoo, rinse your hair and body thoroughly to remove the  shampoo.                           4.  Use CHG as you would any other liquid soap.  You can apply chg directly  to the skin and wash                       Gently with a scrungie or clean washcloth.  5.  Apply the CHG Soap to your body ONLY FROM THE NECK DOWN.   Do not use on face/ open  Wound or open sores. Avoid contact with eyes, ears mouth and genitals (private parts).                       Wash face,  Genitals (private parts) with your normal soap.             6.  Wash thoroughly, paying special attention to the area where your surgery  will be performed.  7.  Thoroughly rinse your body with warm water from the neck down.  8.  DO NOT shower/wash with your normal soap after using and rinsing off  the CHG Soap.                9.  Pat yourself dry with a clean towel.            10.  Wear clean pajamas.            11.  Place clean sheets on your bed the night of your first shower and do not  sleep with pets. Day of Surgery : Do not apply any lotions/deodorants the morning of surgery.  Please wear clean clothes to the hospital/surgery center.  FAILURE TO FOLLOW THESE INSTRUCTIONS MAY RESULT IN THE CANCELLATION OF YOUR SURGERY PATIENT SIGNATURE_________________________________  NURSE SIGNATURE__________________________________  ________________________________________________________________________  WHAT IS A BLOOD TRANSFUSION? Blood Transfusion Information  A transfusion is the replacement of blood or some of its parts. Blood is made up of multiple cells which provide different functions.  Red blood cells carry oxygen and are used for blood loss replacement.  White blood cells fight against infection.  Platelets control bleeding.  Plasma helps clot  blood.  Other blood products are available for specialized needs, such as hemophilia or other clotting disorders. BEFORE THE TRANSFUSION  Who gives blood for transfusions?   Healthy volunteers who are fully evaluated to make sure their blood is safe. This is blood bank blood. Transfusion therapy is the safest it has ever been in the practice of medicine. Before blood is taken from a donor, a complete history is taken to make sure that person has no history of diseases nor engages in risky social behavior (examples are intravenous drug use or sexual activity with multiple partners). The donor's travel history is screened to minimize risk of transmitting infections, such as malaria. The donated blood is tested for signs of infectious diseases, such as HIV and hepatitis. The blood is then tested to be sure it is compatible with you in order to minimize the chance of a transfusion reaction. If you or a relative donates blood, this is often done in anticipation of surgery and is not appropriate for emergency situations. It takes many days to process the donated blood. RISKS AND COMPLICATIONS Although transfusion therapy is very safe and saves many lives, the main dangers of transfusion include:   Getting an infectious disease.  Developing a transfusion reaction. This is an allergic reaction to something in the blood you were given. Every precaution is taken to prevent this. The decision to have a blood transfusion has been considered carefully by your caregiver before blood is given. Blood is not given unless the benefits outweigh the risks. AFTER THE TRANSFUSION  Right after receiving a blood transfusion, you will usually feel much better and more energetic. This is especially true if your red blood cells have gotten low (anemic). The transfusion raises the level of the red blood cells which carry oxygen, and this usually causes an energy increase.  The  nurse administering the transfusion will monitor  you carefully for complications. HOME CARE INSTRUCTIONS  No special instructions are needed after a transfusion. You may find your energy is better. Speak with your caregiver about any limitations on activity for underlying diseases you may have. SEEK MEDICAL CARE IF:   Your condition is not improving after your transfusion.  You develop redness or irritation at the intravenous (IV) site. SEEK IMMEDIATE MEDICAL CARE IF:  Any of the following symptoms occur over the next 12 hours:  Shaking chills.  You have a temperature by mouth above 102 F (38.9 C), not controlled by medicine.  Chest, back, or muscle pain.  People around you feel you are not acting correctly or are confused.  Shortness of breath or difficulty breathing.  Dizziness and fainting.  You get a rash or develop hives.  You have a decrease in urine output.  Your urine turns a dark color or changes to pink, red, or brown. Any of the following symptoms occur over the next 10 days:  You have a temperature by mouth above 102 F (38.9 C), not controlled by medicine.  Shortness of breath.  Weakness after normal activity.  The white part of the eye turns yellow (jaundice).  You have a decrease in the amount of urine or are urinating less often.  Your urine turns a dark color or changes to pink, red, or brown. Document Released: 12/11/2000 Document Revised: 03/07/2012 Document Reviewed: 07/30/2008 Exit[employer] Patient Information 2014 [place].  _______________________________________________________________________  Incentive Spirometer  An incentive spirometer is a tool that can help keep your lungs clear and active. This tool measures how well you are filling your lungs with each breath. Taking long deep breaths may help reverse or decrease the chance of developing breathing (pulmonary) problems (especially infection) following:  A long period of time when you are unable to move or be active. BEFORE THE  PROCEDURE   If the spirometer includes an indicator to show your best effort, your nurse or respiratory therapist will set it to a desired goal.  If possible, sit up straight or lean slightly forward. Try not to slouch.  Hold the incentive spirometer in an upright position. INSTRUCTIONS FOR USE  1. Sit on the edge of your bed if possible, or sit up as far as you can in bed or on a chair. 2. Hold the incentive spirometer in an upright position. 3. Breathe out normally. 4. Place the mouthpiece in your mouth and seal your lips tightly around it. 5. Breathe in slowly and as deeply as possible, raising the piston or the ball toward the top of the column. 6. Hold your breath for 3-5 seconds or for as long as possible. Allow the piston or ball to fall to the bottom of the column. 7. Remove the mouthpiece from your mouth and breathe out normally. 8. Rest for a few seconds and repeat Steps 1 through 7 at least 10 times every 1-2 hours when you are awake. Take your time and take a few normal breaths between deep breaths. 9. The spirometer may include an indicator to show your best effort. Use the indicator as a goal to work toward during each repetition. 10. After each set of 10 deep breaths, practice coughing to be sure your lungs are clear. If you have an incision (the cut made at the time of surgery), support your incision when coughing by placing a pillow or rolled up towels firmly against it. Once you are able to get  out of bed, walk around indoors and cough well. You may stop using the incentive spirometer when instructed by your caregiver.  RISKS AND COMPLICATIONS  Take your time so you do not get dizzy or light-headed.  If you are in pain, you may need to take or ask for pain medication before doing incentive spirometry. It is harder to take a deep breath if you are having pain. AFTER USE  Rest and breathe slowly and easily.  It can be helpful to keep track of a log of your progress. Your  caregiver can provide you with a simple table to help with this. If you are using the spirometer at home, follow these instructions: [place] IF:   You are having difficultly using the spirometer.  You have trouble using the spirometer as often as instructed.  Your pain medication is not giving enough relief while using the spirometer.  You develop fever of 100.5 F (38.1 C) or higher. SEEK IMMEDIATE MEDICAL CARE IF:   You cough up bloody sputum that had not been present before.  You develop fever of 102 F (38.9 C) or greater.  You develop worsening pain at or near the incision site. MAKE SURE YOU:   Understand these instructions.  Will watch your condition.  Will get help right away if you are not doing well or get worse. Document Released: 04/26/2007 Document Revised: 03/07/2012 Document Reviewed: 06/27/2007 [employer] Patient Information 2014 [place], [place].   ________________________________________________________________________

## 2017-07-14 ENCOUNTER — Encounter (HOSPITAL_COMMUNITY)
Admission: RE | Admit: 2017-07-14 | Discharge: 2017-07-14 | Disposition: A | Discharge status: Home / Self Care | Payer: BLUE CROSS/BLUE SHIELD | Source: Ambulatory Visit | Attending: Orthopedic Surgery | Admitting: Orthopedic Surgery

## 2017-07-14 ENCOUNTER — Encounter (HOSPITAL_COMMUNITY): Payer: Self-pay

## 2017-07-14 DIAGNOSIS — M1711 Unilateral primary osteoarthritis, right knee: Secondary | ICD-10-CM | POA: Insufficient documentation

## 2017-07-14 DIAGNOSIS — Z01818 Encounter for other preprocedural examination: Secondary | ICD-10-CM | POA: Insufficient documentation

## 2017-07-14 LAB — COMPREHENSIVE METABOLIC PANEL
ALT: 17 U/L (ref 17–63)
ANION GAP: 8 (ref 5–15)
AST: 22 U/L (ref 15–41)
Albumin: 4.1 g/dL (ref 3.5–5.0)
Alkaline Phosphatase: 59 U/L (ref 38–126)
BILIRUBIN TOTAL: 0.8 mg/dL (ref 0.3–1.2)
BUN: 20 mg/dL (ref 6–20)
CHLORIDE: 102 mmol/L (ref 101–111)
CO2: 26 mmol/L (ref 22–32)
Calcium: 9.2 mg/dL (ref 8.9–10.3)
Creatinine, Ser: 0.86 mg/dL (ref 0.61–1.24)
GFR calc Af Amer: >60 mL/min (ref >60)
Glucose, Bld: 92 mg/dL (ref 65–99)
POTASSIUM: 4 mmol/L (ref 3.5–5.1)
Sodium: 136 mmol/L (ref 135–145)
TOTAL PROTEIN: 6.7 g/dL (ref 6.5–8.1)

## 2017-07-14 LAB — CBC
HCT: 44.2 % (ref 39.0–52.0)
Hemoglobin: 15.5 g/dL (ref 13.0–17.0)
MCH: 31.4 pg (ref 26.0–34.0)
MCHC: 35.1 g/dL (ref 30.0–36.0)
MCV: 89.7 fL (ref 78.0–100.0)
PLATELETS: 220 10*3/uL (ref 150–400)
RBC: 4.93 MIL/uL (ref 4.22–5.81)
RDW: 12.8 % (ref 11.5–15.5)
WBC: 5.9 10*3/uL (ref 4.0–10.5)

## 2017-07-14 LAB — APTT: APTT: 35 s (ref 24–36)

## 2017-07-14 LAB — SURGICAL PCR SCREEN
MRSA, PCR: NEGATIVE
STAPHYLOCOCCUS AUREUS: NEGATIVE

## 2017-07-14 LAB — PROTIME-INR
INR: 0.96
Prothrombin Time: 12.8 seconds (ref 11.4–15.2)

## 2017-07-14 NOTE — Progress Notes (Signed)
Clearance Dr. Azucena CecilSwayne 12/11/16 on chart

## 2017-07-19 ENCOUNTER — Inpatient Hospital Stay (HOSPITAL_COMMUNITY): Payer: BLUE CROSS/BLUE SHIELD | Admitting: Certified Registered Nurse Anesthetist

## 2017-07-19 ENCOUNTER — Encounter (HOSPITAL_COMMUNITY): Payer: Self-pay

## 2017-07-19 ENCOUNTER — Encounter (HOSPITAL_COMMUNITY)
Admission: RE | Disposition: A | Discharge status: Skilled Nursing Facility | Payer: Self-pay | Source: Ambulatory Visit | Attending: Orthopedic Surgery

## 2017-07-19 ENCOUNTER — Inpatient Hospital Stay (HOSPITAL_COMMUNITY)
Admission: RE | Admit: 2017-07-19 | Discharge: 2017-07-22 | DRG: 470 | Disposition: A | Discharge status: Skilled Nursing Facility | Payer: BLUE CROSS/BLUE SHIELD | Source: Ambulatory Visit | Attending: Orthopedic Surgery | Admitting: Orthopedic Surgery

## 2017-07-19 DIAGNOSIS — Z96641 Presence of right artificial hip joint: Secondary | ICD-10-CM | POA: Diagnosis present

## 2017-07-19 DIAGNOSIS — G8929 Other chronic pain: Secondary | ICD-10-CM | POA: Diagnosis present

## 2017-07-19 DIAGNOSIS — M25511 Pain in right shoulder: Secondary | ICD-10-CM | POA: Diagnosis present

## 2017-07-19 DIAGNOSIS — G4730 Sleep apnea, unspecified: Secondary | ICD-10-CM | POA: Diagnosis present

## 2017-07-19 DIAGNOSIS — M1711 Unilateral primary osteoarthritis, right knee: Secondary | ICD-10-CM | POA: Diagnosis present

## 2017-07-19 DIAGNOSIS — M1710 Unilateral primary osteoarthritis, unspecified knee: Secondary | ICD-10-CM | POA: Diagnosis present

## 2017-07-19 DIAGNOSIS — M25512 Pain in left shoulder: Secondary | ICD-10-CM | POA: Diagnosis present

## 2017-07-19 DIAGNOSIS — M179 Osteoarthritis of knee, unspecified: Secondary | ICD-10-CM | POA: Diagnosis present

## 2017-07-19 HISTORY — PX: TOTAL KNEE ARTHROPLASTY: SHX125

## 2017-07-19 LAB — TYPE AND SCREEN
ABO/RH(D): A NEG
ANTIBODY SCREEN: NEGATIVE

## 2017-07-19 LAB — CBC
HCT: 46 % (ref 39.0–52.0)
HEMOGLOBIN: 15.9 g/dL (ref 13.0–17.0)
MCH: 31.4 pg (ref 26.0–34.0)
MCHC: 34.6 g/dL (ref 30.0–36.0)
MCV: 90.9 fL (ref 78.0–100.0)
PLATELETS: 242 10*3/uL (ref 150–400)
RBC: 5.06 MIL/uL (ref 4.22–5.81)
RDW: 13 % (ref 11.5–15.5)
WBC: 13 10*3/uL — AB (ref 4.0–10.5)

## 2017-07-19 LAB — COMPREHENSIVE METABOLIC PANEL
ALK PHOS: 64 U/L (ref 38–126)
ALT: 17 U/L (ref 17–63)
AST: 27 U/L (ref 15–41)
Albumin: 4.1 g/dL (ref 3.5–5.0)
Anion gap: 8 (ref 5–15)
BUN: 14 mg/dL (ref 6–20)
CALCIUM: 9.1 mg/dL (ref 8.9–10.3)
CHLORIDE: 102 mmol/L (ref 101–111)
CO2: 28 mmol/L (ref 22–32)
CREATININE: 0.69 mg/dL (ref 0.61–1.24)
GFR calc Af Amer: >60 mL/min (ref >60)
GFR calc non Af Amer: >60 mL/min (ref >60)
GLUCOSE: 138 mg/dL — AB (ref 65–99)
Potassium: 3.9 mmol/L (ref 3.5–5.1)
SODIUM: 138 mmol/L (ref 135–145)
Total Bilirubin: 0.8 mg/dL (ref 0.3–1.2)
Total Protein: 7 g/dL (ref 6.5–8.1)

## 2017-07-19 LAB — PROTIME-INR
INR: 0.97
Prothrombin Time: 12.9 seconds (ref 11.4–15.2)

## 2017-07-19 LAB — APTT: aPTT: 35 seconds (ref 24–36)

## 2017-07-19 SURGERY — ARTHROPLASTY, KNEE, TOTAL
Anesthesia: Regional | Site: Knee | Laterality: Right

## 2017-07-19 MED ORDER — TRAMADOL HCL 50 MG PO TABS: 50.0000 mg | ORAL_TABLET | Freq: Four times a day (QID) | ORAL | Status: DC | PRN

## 2017-07-19 MED ORDER — TRANEXAMIC ACID 1000 MG/10ML IV SOLN
1000.0000 mg | INTRAVENOUS | Status: AC
  Administered 2017-07-19: 1000 mg via INTRAVENOUS
  Filled 2017-07-19: qty 1100

## 2017-07-19 MED ORDER — DEXAMETHASONE SODIUM PHOSPHATE 10 MG/ML IJ SOLN
10.0000 mg | Freq: Once | INTRAMUSCULAR | Status: DC
  Administered 2017-07-19: 10 mg via INTRAVENOUS

## 2017-07-19 MED ORDER — TRANEXAMIC ACID 1000 MG/10ML IV SOLN
1000.0000 mg | Freq: Once | INTRAVENOUS | Status: AC
  Administered 2017-07-19: 1000 mg via INTRAVENOUS
  Filled 2017-07-19: qty 1100

## 2017-07-19 MED ORDER — PROMETHAZINE HCL 25 MG/ML IJ SOLN: 6.2500 mg | INTRAMUSCULAR | Status: DC | PRN

## 2017-07-19 MED ORDER — MIDAZOLAM HCL 5 MG/ML IJ SOLN
2.0000 mg | Freq: Once | INTRAMUSCULAR | Status: AC
  Administered 2017-07-19: 2 mg via INTRAVENOUS

## 2017-07-19 MED ORDER — ACETAMINOPHEN 10 MG/ML IV SOLN
1000.0000 mg | Freq: Once | INTRAVENOUS | Status: AC
  Administered 2017-07-19: 1000 mg via INTRAVENOUS

## 2017-07-19 MED ORDER — CHLORHEXIDINE GLUCONATE 4 % EX LIQD: 60.0000 mL | Freq: Once | CUTANEOUS | Status: DC

## 2017-07-19 MED ORDER — GABAPENTIN 300 MG PO CAPS
ORAL_CAPSULE | ORAL | Status: AC
  Administered 2017-07-19: 300 mg via ORAL
  Filled 2017-07-19: qty 1

## 2017-07-19 MED ORDER — MORPHINE SULFATE (PF) 4 MG/ML IV SOLN: 1.0000 mg | INTRAVENOUS | Status: DC | PRN

## 2017-07-19 MED ORDER — HYDROMORPHONE HCL-NACL 0.5-0.9 MG/ML-% IV SOSY: 0.2500 mg | PREFILLED_SYRINGE | INTRAVENOUS | Status: DC | PRN

## 2017-07-19 MED ORDER — PHENOL 1.4 % MT LIQD
1.0000 | OROMUCOSAL | Status: DC | PRN
  Filled 2017-07-19: qty 177

## 2017-07-19 MED ORDER — BUPIVACAINE LIPOSOME 1.3 % IJ SUSP
INTRAMUSCULAR | Status: DC | PRN
  Administered 2017-07-19: 20 mL

## 2017-07-19 MED ORDER — SODIUM CHLORIDE 0.9 % IV SOLN
INTRAVENOUS | Status: DC
  Administered 2017-07-19 (×2): via INTRAVENOUS

## 2017-07-19 MED ORDER — FENTANYL CITRATE (PF) 100 MCG/2ML IJ SOLN
INTRAMUSCULAR | Status: DC | PRN
  Administered 2017-07-19: 50 ug via INTRAVENOUS

## 2017-07-19 MED ORDER — RIVAROXABAN 10 MG PO TABS
10.0000 mg | ORAL_TABLET | Freq: Every day | ORAL | Status: DC
  Administered 2017-07-20 – 2017-07-22 (×3): 10 mg via ORAL
  Filled 2017-07-19 (×3): qty 1

## 2017-07-19 MED ORDER — ACETAMINOPHEN 325 MG PO TABS: 650.0000 mg | ORAL_TABLET | Freq: Four times a day (QID) | ORAL | Status: DC | PRN

## 2017-07-19 MED ORDER — DEXAMETHASONE SODIUM PHOSPHATE 10 MG/ML IJ SOLN
INTRAMUSCULAR | Status: AC
  Filled 2017-07-19: qty 1

## 2017-07-19 MED ORDER — FLEET ENEMA 7-19 GM/118ML RE ENEM: 1.0000 | ENEMA | Freq: Once | RECTAL | Status: DC | PRN

## 2017-07-19 MED ORDER — PROPOFOL 10 MG/ML IV BOLUS
INTRAVENOUS | Status: DC | PRN
  Administered 2017-07-19: 20 mg via INTRAVENOUS

## 2017-07-19 MED ORDER — LACTATED RINGERS IV SOLN: INTRAVENOUS | Status: DC

## 2017-07-19 MED ORDER — BUPIVACAINE IN DEXTROSE 0.75-8.25 % IT SOLN
INTRATHECAL | Status: DC | PRN
  Administered 2017-07-19: 2 mL via INTRATHECAL

## 2017-07-19 MED ORDER — PROPOFOL 500 MG/50ML IV EMUL
INTRAVENOUS | Status: DC | PRN
  Administered 2017-07-19: 100 ug/kg/min via INTRAVENOUS

## 2017-07-19 MED ORDER — MIDAZOLAM HCL 2 MG/2ML IJ SOLN
INTRAMUSCULAR | Status: AC
  Filled 2017-07-19: qty 2

## 2017-07-19 MED ORDER — DEXAMETHASONE SODIUM PHOSPHATE 10 MG/ML IJ SOLN
10.0000 mg | Freq: Once | INTRAMUSCULAR | Status: AC
  Administered 2017-07-20: 10 mg via INTRAVENOUS
  Filled 2017-07-19: qty 1

## 2017-07-19 MED ORDER — DOCUSATE SODIUM 100 MG PO CAPS
100.0000 mg | ORAL_CAPSULE | Freq: Two times a day (BID) | ORAL | Status: DC
  Administered 2017-07-19 – 2017-07-21 (×4): 100 mg via ORAL
  Filled 2017-07-19 (×6): qty 1

## 2017-07-19 MED ORDER — LACTATED RINGERS IV SOLN
INTRAVENOUS | Status: DC
  Administered 2017-07-19 (×2): via INTRAVENOUS

## 2017-07-19 MED ORDER — METOCLOPRAMIDE HCL 5 MG PO TABS: 5.0000 mg | ORAL_TABLET | Freq: Three times a day (TID) | ORAL | Status: DC | PRN

## 2017-07-19 MED ORDER — MIDAZOLAM HCL 5 MG/5ML IJ SOLN
INTRAMUSCULAR | Status: DC | PRN
  Administered 2017-07-19: 2 mg via INTRAVENOUS

## 2017-07-19 MED ORDER — CEFAZOLIN SODIUM-DEXTROSE 2-4 GM/100ML-% IV SOLN: 2.0000 g | INTRAVENOUS | Status: DC

## 2017-07-19 MED ORDER — SODIUM CHLORIDE 0.9 % IJ SOLN
INTRAMUSCULAR | Status: AC
  Filled 2017-07-19: qty 10

## 2017-07-19 MED ORDER — SODIUM CHLORIDE 0.9 % IJ SOLN
INTRAMUSCULAR | Status: AC
  Filled 2017-07-19: qty 50

## 2017-07-19 MED ORDER — 0.9 % SODIUM CHLORIDE (POUR BTL) OPTIME
TOPICAL | Status: DC | PRN
  Administered 2017-07-19: 1000 mL

## 2017-07-19 MED ORDER — ACETAMINOPHEN 10 MG/ML IV SOLN: 1000.0000 mg | Freq: Once | INTRAVENOUS | Status: DC

## 2017-07-19 MED ORDER — ACETAMINOPHEN 10 MG/ML IV SOLN
INTRAVENOUS | Status: AC
  Filled 2017-07-19: qty 100

## 2017-07-19 MED ORDER — CEFAZOLIN SODIUM-DEXTROSE 2-4 GM/100ML-% IV SOLN
2.0000 g | INTRAVENOUS | Status: AC
  Administered 2017-07-19: 2 g via INTRAVENOUS

## 2017-07-19 MED ORDER — FENTANYL CITRATE (PF) 100 MCG/2ML IJ SOLN
INTRAMUSCULAR | Status: AC
  Administered 2017-07-19: 50 ug via INTRAVENOUS
  Filled 2017-07-19: qty 2

## 2017-07-19 MED ORDER — MIDAZOLAM HCL 2 MG/2ML IJ SOLN
INTRAMUSCULAR | Status: AC
Start: 2017-07-19 — End: 2017-07-19
  Filled 2017-07-19: qty 2

## 2017-07-19 MED ORDER — DIPHENHYDRAMINE HCL 12.5 MG/5ML PO ELIX: 12.5000 mg | ORAL_SOLUTION | ORAL | Status: DC | PRN

## 2017-07-19 MED ORDER — BISACODYL 10 MG RE SUPP: 10.0000 mg | Freq: Every day | RECTAL | Status: DC | PRN

## 2017-07-19 MED ORDER — POLYETHYLENE GLYCOL 3350 17 G PO PACK: 17.0000 g | PACK | Freq: Every day | ORAL | Status: DC | PRN

## 2017-07-19 MED ORDER — GABAPENTIN 300 MG PO CAPS: 300.0000 mg | ORAL_CAPSULE | Freq: Once | ORAL | Status: DC

## 2017-07-19 MED ORDER — GABAPENTIN 300 MG PO CAPS
300.0000 mg | ORAL_CAPSULE | Freq: Once | ORAL | Status: AC
  Administered 2017-07-19: 300 mg via ORAL

## 2017-07-19 MED ORDER — KETOROLAC TROMETHAMINE 30 MG/ML IJ SOLN: 30.0000 mg | Freq: Once | INTRAMUSCULAR | Status: DC | PRN

## 2017-07-19 MED ORDER — STERILE WATER FOR IRRIGATION IR SOLN
Status: DC | PRN
  Administered 2017-07-19: 2000 mL

## 2017-07-19 MED ORDER — METOCLOPRAMIDE HCL 5 MG/ML IJ SOLN: 5.0000 mg | Freq: Three times a day (TID) | INTRAMUSCULAR | Status: DC | PRN

## 2017-07-19 MED ORDER — ACETAMINOPHEN 500 MG PO TABS
1000.0000 mg | ORAL_TABLET | Freq: Four times a day (QID) | ORAL | Status: AC
  Administered 2017-07-19 – 2017-07-20 (×4): 1000 mg via ORAL
  Filled 2017-07-19 (×4): qty 2

## 2017-07-19 MED ORDER — FENTANYL CITRATE (PF) 100 MCG/2ML IJ SOLN
INTRAMUSCULAR | Status: AC
  Filled 2017-07-19: qty 2

## 2017-07-19 MED ORDER — BUPIVACAINE LIPOSOME 1.3 % IJ SUSP
20.0000 mL | Freq: Once | INTRAMUSCULAR | Status: DC
  Filled 2017-07-19: qty 20

## 2017-07-19 MED ORDER — CEFAZOLIN SODIUM-DEXTROSE 2-4 GM/100ML-% IV SOLN
INTRAVENOUS | Status: AC
  Filled 2017-07-19: qty 100

## 2017-07-19 MED ORDER — ONDANSETRON HCL 4 MG/2ML IJ SOLN: 4.0000 mg | Freq: Four times a day (QID) | INTRAMUSCULAR | Status: DC | PRN

## 2017-07-19 MED ORDER — CEFAZOLIN SODIUM-DEXTROSE 2-4 GM/100ML-% IV SOLN
2.0000 g | Freq: Four times a day (QID) | INTRAVENOUS | Status: AC
  Administered 2017-07-19 (×2): 2 g via INTRAVENOUS
  Filled 2017-07-19 (×2): qty 100

## 2017-07-19 MED ORDER — MENTHOL 3 MG MT LOZG: 1.0000 | LOZENGE | OROMUCOSAL | Status: DC | PRN

## 2017-07-19 MED ORDER — ACETAMINOPHEN 650 MG RE SUPP: 650.0000 mg | Freq: Four times a day (QID) | RECTAL | Status: DC | PRN

## 2017-07-19 MED ORDER — PROPOFOL 10 MG/ML IV BOLUS
INTRAVENOUS | Status: AC
  Filled 2017-07-19: qty 60

## 2017-07-19 MED ORDER — MEPERIDINE HCL 25 MG/ML IJ SOLN: 6.2500 mg | INTRAMUSCULAR | Status: DC | PRN

## 2017-07-19 MED ORDER — SODIUM CHLORIDE 0.9 % IR SOLN
Status: DC | PRN
  Administered 2017-07-19: 1000 mL

## 2017-07-19 MED ORDER — METHOCARBAMOL 1000 MG/10ML IJ SOLN
500.0000 mg | Freq: Four times a day (QID) | INTRAVENOUS | Status: DC | PRN
  Administered 2017-07-19: 500 mg via INTRAVENOUS
  Filled 2017-07-19: qty 550

## 2017-07-19 MED ORDER — OXYCODONE HCL 5 MG PO TABS
5.0000 mg | ORAL_TABLET | ORAL | Status: DC | PRN
  Administered 2017-07-19 – 2017-07-20 (×2): 5 mg via ORAL
  Administered 2017-07-20 (×2): 10 mg via ORAL
  Administered 2017-07-20: 5 mg via ORAL
  Administered 2017-07-20 – 2017-07-21 (×3): 10 mg via ORAL
  Administered 2017-07-21: 5 mg via ORAL
  Administered 2017-07-22: 10 mg via ORAL
  Filled 2017-07-19 (×4): qty 2
  Filled 2017-07-19: qty 1
  Filled 2017-07-19 (×2): qty 2
  Filled 2017-07-19 (×2): qty 1
  Filled 2017-07-19 (×2): qty 2

## 2017-07-19 MED ORDER — DEXAMETHASONE SODIUM PHOSPHATE 10 MG/ML IJ SOLN: 10.0000 mg | Freq: Once | INTRAMUSCULAR | Status: DC

## 2017-07-19 MED ORDER — ONDANSETRON HCL 4 MG PO TABS: 4.0000 mg | ORAL_TABLET | Freq: Four times a day (QID) | ORAL | Status: DC | PRN

## 2017-07-19 MED ORDER — FENTANYL CITRATE (PF) 100 MCG/2ML IJ SOLN
100.0000 ug | Freq: Once | INTRAMUSCULAR | Status: AC
  Administered 2017-07-19: 50 ug via INTRAVENOUS

## 2017-07-19 MED ORDER — METHOCARBAMOL 500 MG PO TABS
500.0000 mg | ORAL_TABLET | Freq: Four times a day (QID) | ORAL | Status: DC | PRN
  Administered 2017-07-19 – 2017-07-22 (×4): 500 mg via ORAL
  Filled 2017-07-19 (×5): qty 1

## 2017-07-19 MED ORDER — SODIUM CHLORIDE 0.9 % IJ SOLN
INTRAMUSCULAR | Status: DC | PRN
  Administered 2017-07-19: 60 mL

## 2017-07-19 SURGICAL SUPPLY — 51 items
BAG DECANTER FOR FLEXI CONT (MISCELLANEOUS) ×2 IMPLANT
BAG SPEC THK2 15X12 ZIP CLS (MISCELLANEOUS) ×1
BAG ZIPLOCK 12X15 (MISCELLANEOUS) ×2 IMPLANT
BANDAGE ACE 6X5 VEL STRL LF (GAUZE/BANDAGES/DRESSINGS) ×2 IMPLANT
BLADE SAG 18X100X1.27 (BLADE) ×2 IMPLANT
BLADE SAW SGTL 11.0X1.19X90.0M (BLADE) ×2 IMPLANT
BOWL SMART MIX CTS (DISPOSABLE) ×2 IMPLANT
CAPT KNEE TOTAL 3 ATTUNE ×1 IMPLANT
CEMENT HV SMART SET (Cement) ×4 IMPLANT
COVER SURGICAL LIGHT HANDLE (MISCELLANEOUS) ×2 IMPLANT
CUFF TOURN SGL QUICK 34 (TOURNIQUET CUFF) ×2
CUFF TRNQT CYL 34X4X40X1 (TOURNIQUET CUFF) ×1 IMPLANT
DECANTER SPIKE VIAL GLASS SM (MISCELLANEOUS) ×2 IMPLANT
DRAPE U-SHAPE 47X51 STRL (DRAPES) ×2 IMPLANT
DRSG ADAPTIC 3X8 NADH LF (GAUZE/BANDAGES/DRESSINGS) ×2 IMPLANT
DURAPREP 26ML APPLICATOR (WOUND CARE) ×2 IMPLANT
ELECT REM PT RETURN 15FT ADLT (MISCELLANEOUS) ×2 IMPLANT
EVACUATOR 1/8 PVC DRAIN (DRAIN) ×2 IMPLANT
GAUZE SPONGE 4X4 12PLY STRL (GAUZE/BANDAGES/DRESSINGS) ×2 IMPLANT
GLOVE BIO SURGEON STRL SZ7 (GLOVE) ×1 IMPLANT
GLOVE BIO SURGEON STRL SZ7.5 (GLOVE) ×1 IMPLANT
GLOVE BIO SURGEON STRL SZ8 (GLOVE) ×2 IMPLANT
GLOVE BIOGEL PI IND STRL 7.0 (GLOVE) IMPLANT
GLOVE BIOGEL PI IND STRL 7.5 (GLOVE) IMPLANT
GLOVE BIOGEL PI IND STRL 8 (GLOVE) ×1 IMPLANT
GLOVE BIOGEL PI INDICATOR 7.0 (GLOVE) ×2
GLOVE BIOGEL PI INDICATOR 7.5 (GLOVE) ×4
GLOVE BIOGEL PI INDICATOR 8 (GLOVE) ×2
GLOVE SURG SS PI 7.5 STRL IVOR (GLOVE) ×1 IMPLANT
GOWN STRL REUS W/ TWL XL LVL3 (GOWN DISPOSABLE) IMPLANT
GOWN STRL REUS W/TWL LRG LVL3 (GOWN DISPOSABLE) ×2 IMPLANT
GOWN STRL REUS W/TWL XL LVL3 (GOWN DISPOSABLE) ×5 IMPLANT
HANDPIECE INTERPULSE COAX TIP (DISPOSABLE) ×2
IMMOBILIZER KNEE 20 (SOFTGOODS) ×2
IMMOBILIZER KNEE 20 THIGH 36 (SOFTGOODS) ×1 IMPLANT
MANIFOLD NEPTUNE II (INSTRUMENTS) ×2 IMPLANT
PACK TOTAL KNEE CUSTOM (KITS) ×2 IMPLANT
PAD ABD 8X10 STRL (GAUZE/BANDAGES/DRESSINGS) ×1 IMPLANT
PADDING CAST COTTON 6X4 STRL (CAST SUPPLIES) ×6 IMPLANT
POSITIONER SURGICAL ARM (MISCELLANEOUS) ×2 IMPLANT
SET HNDPC FAN SPRY TIP SCT (DISPOSABLE) ×1 IMPLANT
STRIP CLOSURE SKIN 1/2X4 (GAUZE/BANDAGES/DRESSINGS) ×4 IMPLANT
SUT MNCRL AB 4-0 PS2 18 (SUTURE) ×2 IMPLANT
SUT STRATAFIX 0 PDS 27 VIOLET (SUTURE) ×2
SUT VIC AB 2-0 CT1 27 (SUTURE) ×6
SUT VIC AB 2-0 CT1 TAPERPNT 27 (SUTURE) ×3 IMPLANT
SUTURE STRATFX 0 PDS 27 VIOLET (SUTURE) ×1 IMPLANT
SYR 30ML LL (SYRINGE) ×4 IMPLANT
TRAY FOLEY W/METER SILVER 16FR (SET/KITS/TRAYS/PACK) ×2 IMPLANT
WRAP KNEE MAXI GEL POST OP (GAUZE/BANDAGES/DRESSINGS) ×2 IMPLANT
YANKAUER SUCT BULB TIP 10FT TU (MISCELLANEOUS) ×2 IMPLANT

## 2017-07-19 NOTE — Anesthesia Procedure Notes (Signed)
Date/Time: 07/19/2017 12:22 PM Performed by: Leroy LibmanEARDON, Choya Tornow L Oxygen Delivery Method: Simple face mask

## 2017-07-19 NOTE — Anesthesia Procedure Notes (Signed)
Spinal  Patient location during procedure: OR Staffing Anesthesiologist: Nolon Nations Performed: anesthesiologist  Preanesthetic Checklist Completed: patient identified, site marked, surgical consent, pre-op evaluation, timeout performed, IV checked, risks and benefits discussed and monitors and equipment checked Spinal Block Patient position: sitting Prep: Betadine and site prepped and draped Patient monitoring: heart rate, continuous pulse ox and blood pressure Approach: right paramedian Location: L3-4 Injection technique: single-shot Needle Needle type: Sprotte  Needle gauge: 24 G Needle length: 9 cm Additional Notes Expiration date of kit checked and confirmed. Patient tolerated procedure well, without complications.

## 2017-07-19 NOTE — Anesthesia Procedure Notes (Addendum)
Date/Time: 07/19/2017 12:45 PM Performed by: Leroy LibmanEARDON, Bill Mura L Ventilation: Nasal airway inserted- appropriate to patient size

## 2017-07-19 NOTE — Interval H&P Note (Signed)
History and Physical Interval Note:  07/19/2017 10:12 AM  Bill [name]  has presented today for surgery, with the diagnosis of Osteoarthritis Right Knee  The various methods of treatment have been discussed with the patient and family. After consideration of risks, benefits and other options for treatment, the patient has consented to  Procedure(s): RIGHT TOTAL KNEE ARTHROPLASTY (Right) as a surgical intervention .  The patient's history has been reviewed, patient examined, no change in status, stable for surgery.  I have reviewed the patient's chart and labs.  Questions were answered to the patient's satisfaction.     Loanne DrillingALUISIO,Kaena Santori V

## 2017-07-19 NOTE — Discharge Instructions (Addendum)
°  ° °Dr. Frank Aluisio °Total Joint Specialist °[facility] °[address] °[place], [place] [address] °[phone] ° °TOTAL KNEE REPLACEMENT POSTOPERATIVE DIRECTIONS ° °Knee Rehabilitation, Guidelines Following Surgery  °Results after knee surgery are often greatly improved when you follow the exercise, range of motion and muscle strengthening exercises prescribed by your doctor. Safety measures are also important to protect the knee from further injury. Any time any of these exercises cause you to have increased pain or swelling in your knee joint, decrease the amount until you are comfortable again and slowly increase them. If you have problems or questions, call your caregiver or physical therapist for advice.  ° °HOME CARE INSTRUCTIONS  °Remove items at home which could result in a fall. This includes throw rugs or furniture in walking pathways.  °· ICE to the affected knee every three hours for 30 minutes at a time and then as needed for pain and swelling.  Continue to use ice on the knee for pain and swelling from surgery. You may notice swelling that will progress down to the foot and ankle.  This is normal after surgery.  Elevate the leg when you are not up walking on it.   °· Continue to use the breathing machine which will help keep your temperature down.  It is common for your temperature to cycle up and down following surgery, especially at night when you are not up moving around and exerting yourself.  The breathing machine keeps your lungs expanded and your temperature down. °· Do not place pillow under knee, focus on keeping the knee straight while resting ° °DIET °You may resume your previous home diet once your are discharged from the hospital. ° °DRESSING / WOUND CARE / SHOWERING °You may shower 3 days after surgery, but keep the wounds dry during showering.  You may use an occlusive plastic wrap (Press'n Seal for example), NO SOAKING/SUBMERGING IN THE BATHTUB.  If the  bandage gets wet, change with a clean dry gauze.  If the incision gets wet, pat the wound dry with a clean towel. °You may start showering once you are discharged home but do not submerge the incision under water. Just pat the incision dry and apply a dry gauze dressing on daily. °Change the surgical dressing daily and reapply a dry dressing each time. ° °ACTIVITY °Walk with your walker as instructed. °Use walker as long as suggested by your caregivers. °Avoid periods of inactivity such as sitting longer than an hour when not asleep. This helps prevent blood clots.  °You may resume a sexual relationship in one month or when given the OK by your doctor.  °You may return to work once you are cleared by your doctor.  °Do not drive a car for 6 weeks or until released by you surgeon.  °Do not drive while taking narcotics. ° °WEIGHT BEARING °Weight bearing as tolerated with assist device (walker, cane, etc) as directed, use it as long as suggested by your surgeon or therapist, typically at least 4-6 weeks. ° °POSTOPERATIVE CONSTIPATION PROTOCOL °Constipation - defined medically as fewer than three stools per week and severe constipation as less than one stool per week. ° °One of the most common issues patients have following surgery is constipation.  Even if you have a regular bowel pattern at home, your normal regimen is likely to be disrupted due to multiple reasons following surgery.  Combination of anesthesia, postoperative narcotics, change in appetite and fluid intake all can affect your   bowels.  In order to avoid complications following surgery, here are some recommendations in order to help you during your recovery period. ° °Colace (docusate) - Pick up an over-the-counter form of Colace or another stool softener and take twice a day as long as you are requiring postoperative pain medications.  Take with a full glass of water daily.  If you experience loose stools or diarrhea, hold the colace until you stool forms  back up.  If your symptoms do not get better within 1 week or if they get worse, check with your doctor. ° °Dulcolax (bisacodyl) - Pick up over-the-counter and take as directed by the product packaging as needed to assist with the movement of your bowels.  Take with a full glass of water.  Use this product as needed if not relieved by Colace only.  ° °MiraLax (polyethylene glycol) - Pick up over-the-counter to have on hand.  MiraLax is a solution that will increase the amount of water in your bowels to assist with bowel movements.  Take as directed and can mix with a glass of water, juice, soda, coffee, or tea.  Take if you go more than two days without a movement. °Do not use MiraLax more than once per day. Call your doctor if you are still constipated or irregular after using this medication for 7 days in a row. ° °If you continue to have problems with postoperative constipation, please contact the office for further assistance and recommendations.  If you experience "the worst abdominal pain ever" or develop nausea or vomiting, please contact the office immediatly for further recommendations for treatment. ° °ITCHING ° If you experience itching with your medications, try taking only a single pain pill, or even half a pain pill at a time.  You can also use Benadryl over the counter for itching or also to help with sleep.  ° °TED HOSE STOCKINGS °Wear the elastic stockings on both legs for three weeks following surgery during the day but you may remove then at night for sleeping. ° °MEDICATIONS °See your medication summary on the “After Visit Summary” that the nursing staff will review with you prior to discharge.  You may have some home medications which will be placed on hold until you complete the course of blood thinner medication.  It is important for you to complete the blood thinner medication as prescribed by your surgeon.  Continue your approved medications as instructed at time of  discharge. ° °PRECAUTIONS °If you experience chest pain or shortness of breath - call 911 immediately for transfer to the hospital emergency department.  °If you develop a fever greater that 101 F, purulent drainage from wound, increased redness or drainage from wound, foul odor from the wound/dressing, or calf pain - CONTACT YOUR SURGEON.   °                                                °FOLLOW-UP APPOINTMENTS °Make sure you keep all of your appointments after your operation with your surgeon and caregivers. You should call the office at the above phone number and make an appointment for approximately two weeks after the date of your surgery or on the date instructed by your surgeon outlined in the "After Visit Summary". ° ° °RANGE OF MOTION AND STRENGTHENING EXERCISES  °Rehabilitation of the knee is important following a knee   injury or an operation. After just a few days of immobilization, the muscles of the thigh which control the knee become weakened and shrink (atrophy). Knee exercises are designed to build up the tone and strength of the thigh muscles and to improve knee motion. Often times heat used for twenty to thirty minutes before working out will loosen up your tissues and help with improving the range of motion but do not use heat for the first two weeks following surgery. These exercises can be done on a training (exercise) mat, on the floor, on a table or on a bed. Use what ever works the best and is most comfortable for you Knee exercises include:  °Leg Lifts - While your knee is still immobilized in a splint or cast, you can do straight leg raises. Lift the leg to 60 degrees, hold for 3 sec, and slowly lower the leg. Repeat 10-20 times 2-3 times daily. Perform this exercise against resistance later as your knee gets better.  °Quad and Hamstring Sets - Tighten up the muscle on the front of the thigh (Quad) and hold for 5-10 sec. Repeat this 10-20 times hourly. Hamstring sets are done by pushing the  foot backward against an object and holding for 5-10 sec. Repeat as with quad sets.  °· Leg Slides: Lying on your back, slowly slide your foot toward your buttocks, bending your knee up off the floor (only go as far as is comfortable). Then slowly slide your foot back down until your leg is flat on the floor again. °· Angel Wings: Lying on your back spread your legs to the side as far apart as you can without causing discomfort.  °A rehabilitation program following serious knee injuries can speed recovery and prevent re-injury in the future due to weakened muscles. Contact your doctor or a physical therapist for more information on knee rehabilitation.  ° °IF YOU ARE TRANSFERRED TO A SKILLED REHAB FACILITY °If the patient is transferred to a skilled rehab facility following release from the hospital, a list of the current medications will be sent to the facility for the patient to continue.  When discharged from the skilled rehab facility, please have the facility set up the patient's Home Health Physical Therapy prior to being released. Also, the skilled facility will be responsible for providing the patient with their medications at time of release from the facility to include their pain medication, the muscle relaxants, and their blood thinner medication. If the patient is still at the rehab facility at time of the two week follow up appointment, the skilled rehab facility will also need to assist the patient in arranging follow up appointment in our office and any transportation needs. ° °MAKE SURE YOU:  °Understand these instructions.  °Get help right away if you are not doing well or get worse.  ° ° °Pick up stool softner and laxative for home use following surgery while on pain medications. °Do not submerge incision under water. °Please use good hand washing techniques while changing dressing each day. °May shower starting three days after surgery. °Please use a clean towel to pat the incision dry following  showers. °Continue to use ice for pain and swelling after surgery. °Do not use any lotions or creams on the incision until instructed by your surgeon. ° °Take Xarelto for two and a half more weeks following discharge from the hospital, then discontinue Xarelto. °Once the patient has completed the blood thinner regimen, then take a Baby 81 mg Aspirin daily   for three more weeks. ° ° °Information on my medicine - XARELTO® (Rivaroxaban) ° ° °Why was Xarelto® prescribed for you? °Xarelto® was prescribed for you to reduce the risk of blood clots forming after orthopedic surgery. The medical term for these abnormal blood clots is venous thromboembolism (VTE). ° °What do you need to know about xarelto® ? °Take your Xarelto® ONCE DAILY at the same time every day. °You may take it either with or without food. ° °If you have difficulty swallowing the tablet whole, you may crush it and mix in applesauce just prior to taking your dose. ° °Take Xarelto® exactly as prescribed by your doctor and DO NOT stop taking Xarelto® without talking to the doctor who prescribed the medication.  Stopping without other VTE prevention medication to take the place of Xarelto® may increase your risk of developing a clot. ° °After discharge, you should have regular check-up appointments with your healthcare provider that is prescribing your Xarelto®.   ° °What do you do if you miss a dose? °If you miss a dose, take it as soon as you remember on the same day then continue your regularly scheduled once daily regimen the next day. Do not take two doses of Xarelto® on the same day.  ° °Important Safety Information °A possible side effect of Xarelto® is bleeding. You should call your healthcare provider right away if you experience any of the following: °? Bleeding from an injury or your nose that does not stop. °? Unusual colored urine (red or dark brown) or unusual colored stools (red or black). °? Unusual bruising for unknown reasons. °? A serious  fall or if you hit your head (even if there is no bleeding). ° °Some medicines may interact with Xarelto® and might increase your risk of bleeding while on Xarelto®. To help avoid this, consult your healthcare provider or pharmacist prior to using any new prescription or non-prescription medications, including herbals, vitamins, non-steroidal anti-inflammatory drugs (NSAIDs) and supplements. ° °This website has more information on Xarelto®: www.xarelto.com. ° ° °

## 2017-07-19 NOTE — Progress Notes (Signed)
Assisted Dr. Germeroth with right, ultrasound guided, adductor canal block. Side rails up, monitors on throughout procedure. See vital signs in flow sheet. Tolerated Procedure well. 

## 2017-07-19 NOTE — Progress Notes (Signed)
Pt unsure of what time he wants CPAP.  Pt states that he will call when he wants RT to bring machine up.  RT to monitor and assess as needed.

## 2017-07-19 NOTE — Op Note (Signed)
Pre-operative diagnosis- Osteoarthritis Right knee(s)  Post-operative diagnosis- Osteoarthritis  Right knee(s)  Procedure-   Right Total Knee Arthroplasty  Surgeon- Bill RankinFrank V. Jeramey Lanuza, MD  Assistant- Avel Peacerew Perkins, PA-C   Anesthesia-  Adductor canal block and spinal   EBL- * No blood loss amount entered *   Drains Hemovac x 1   Tourniquet time  Total Tourniquet Time Documented: Thigh (Right) - 40 minutes Total: Thigh (Right) - 40 minutes    Complications- None  Condition-PACU - hemodynamically stable.   Brief Clinical Note  Bill [name] is a 61 y.o. year old male with end stage OA of his right knee with progressively worsening pain and dysfunction. He has constant pain, with activity and at rest and significant functional deficits with difficulties even with ADLs. He has had extensive non-op management including analgesics, injections of cortisone and viscosupplements, and home exercise program, but remains in significant pain with significant dysfunction. Radiographs show bone on bone arthritis lateral and patellofemoral with severe valgus deformity. He presents now for right Total Knee Arthroplasty.    Procedure in detail---       The patient is brought into the operating room and positioned supine on the operating table. After successful administration of Adductor canal block and spinal anesthetic, a tourniquet is placed high on the Right thigh(s) and the lower extremity is prepped and draped in the usual sterile fashion. Time out is performed by the operating team and then the Right  lower extremity is wrapped in Esmarch, knee flexed and the tourniquet inflated to 300 mmHg.       A midline incision is made with a ten blade through the subcutaneous tissue to the level of the extensor mechanism. A fresh blade is used to make a lateral parapatellar arthrotomy due to the patients' valgus deformity. Soft tissue over the proximal lateral tibia is subperiosteally elevated to the joint line  with a knife to the posterolateral corner but not including the structures of the posterolateral corner. Soft tissue over the proximal medial tibia is elevated with attention being paid to avoiding the patellar tendon on the tibial tubercle. The patella is everted medially, knee flexed 90 degrees and the ACL and PCL are removed. Findings are bone on bone lateral and patellofemoral with large global osteophytes. .       The drill is used to create a starting hole in the distal femur and the canal is thoroughly irrigated with sterile saline to remove the fatty contents. The 5 degree Right  valgus alignment guide is placed into the femoral canal and the distal femoral cutting block is pinned to remove 9  mm off the distal femur. Resection is made with an oscillating saw.      The tibia is subluxed forward and the menisci are removed. The extramedullary alignment guide is placed referencing proximally at the medial aspect of the tibial tubercle and distally along the second metatarsal axis and tibial crest. The block is pinned to remove 2mm off the more deficient lateral side. Resection is made with an oscillating saw. Size 7  is the most appropriate size for the tibia and the proximal tibia is prepared with the modular drill and keel punch for that size.      The femoral sizing guide is placed and size 7  is most appropriate. Rotation is marked off the epicondylar axis and confirmed by creating a rectangular flexion gap at 90 degrees. The size 7  cutting block is pinned in this rotation and the  anterior, posterior and chamfer cuts are made with the oscillating saw. The intercondylar block is then placed and that cut is made.      Trial size 7  tibial component, trial size 7  posterior stabilized femur and a 12  mm posterior stabilized rotating platform insert trial is placed. Full extension is achieved with excellent varus/valgus and   anterior/posterior balance throughout full range of motion. The patella is  everted and thickness measured to be 27  mm. Free hand resection is taken to 15 mm, a 41 template is placed, lug holes are drilled, trial patella is placed, and it tracks normally. Osteophytes are removed off the posterior femur with the trial in place. All trials are removed and the cut bone surfaces prepared with pulsatile lavage. Cement is mixed and once ready for implantation, the size 7  tibial implant, size 7 posterior stabilized femoral component, and the size 41  patella are cemented in place and the patella is held with the clamp. The trial insert is placed and the knee held in full extension. The Exparel (20 ml mixed with 60 ml saline)  is injected into the extensor mechanism, posterior capsule, medial and lateral gutters and subcutaneous tissues. All extruded cement is removed and once the cement is hard the permanent 12  mm posterior stabilized rotating platform insert is placed into the tibial tray.      The wound is copiously irrigated with saline solution and the tourniquet is released for a total   tourniquet time of 40  minutes. Bleeding is identified and controlled with electrocautery. The extensor mechanism is closed with interrupted #1 V-loc leaving open a small area from the superior to inferior pole of the patella to serve as a mini lateral release. Flexion against gravity is 135  degrees and the patella tracks normally. Subcutaneous tissue is closed with 2.0 vicryl and subcuticular with running 4.0 Monocryl.The incision is cleaned and dried and steri-strips and a bulky sterile dressing are applied. The limb is placed into a knee immobilizer and the patient is awakened and transported to recovery in stable condition.      Please note that a surgical assistant was a medical necessity for this procedure in order to perform it in a safe and expeditious manner. Surgical assistant was necessary to retract the ligaments and vital neurovascular structures to prevent injury to them and also necessary  for proper positioning of the limb to allow for anatomic placement of the prosthesis.    Bill [name] Bill Voeltz, MD    07/19/2017, 1:39 PM

## 2017-07-19 NOTE — Anesthesia Preprocedure Evaluation (Signed)
Anesthesia Evaluation  Patient identified by MRN, date of birth, ID band Patient awake    Reviewed: Allergy & Precautions, H&P , NPO status , Patient's Chart, lab work & pertinent test results  Airway Mallampati: II  TM Distance: >3 FB Neck ROM: full    Dental no notable dental hx. (+) Teeth Intact, Dental Advisory Given   Pulmonary neg pulmonary ROS, sleep apnea and Continuous Positive Airway Pressure Ventilation ,    Pulmonary exam normal breath sounds clear to auscultation       Cardiovascular Exercise Tolerance: Good (-) anginanegative cardio ROS Normal cardiovascular exam Rhythm:regular Rate:Normal  History CP not worked up but no further issues   Neuro/Psych negative neurological ROS  negative psych ROS   GI/Hepatic negative GI ROS, Neg liver ROS,   Endo/Other  negative endocrine ROS  Renal/GU negative Renal ROS     Musculoskeletal  (+) Arthritis ,   Abdominal   Peds  Hematology negative hematology ROS (+)   Anesthesia Other Findings   Reproductive/Obstetrics negative OB ROS                             Anesthesia Physical  Anesthesia Plan  ASA: III  Anesthesia Plan: Spinal and Regional   Post-op Pain Management:  Regional for Post-op pain   Induction:   PONV Risk Score and Plan: 2 and Ondansetron, Dexamethasone, Propofol and Treatment may vary due to age or medical condition  Airway Management Planned: Simple Face Mask  Additional Equipment:   Intra-op Plan:   Post-operative Plan:   Informed Consent: I have reviewed the patients History and Physical, chart, labs and discussed the procedure including the risks, benefits and alternatives for the proposed anesthesia with the patient or authorized representative who has indicated his/her understanding and acceptance.   Dental Advisory Given and Dental advisory given  Plan Discussed with: CRNA  Anesthesia Plan  Comments:         Anesthesia Quick Evaluation

## 2017-07-19 NOTE — Transfer of Care (Signed)
Immediate Anesthesia Transfer of Care Note  Patient: Brennan BaileyJerry W Terrio  Procedure(s) Performed: Procedure(s) with comments: RIGHT TOTAL KNEE ARTHROPLASTY (Right) - Adductor Block  Patient Location: PACU  Anesthesia Type:Spinal  Level of Consciousness: sedated  Airway & Oxygen Therapy: Patient Spontanous Breathing and Patient connected to face mask oxygen  Post-op Assessment: Report given to RN and Post -op Vital signs reviewed and stable  Post vital signs: Reviewed and stable  Last Vitals:  Vitals:   07/19/17 1200 07/19/17 1415  BP: 103/61 (P) 120/68  Pulse: 78 (P) 77  Resp: 16 (P) 13  Temp:  (P) 36.4 C    Last Pain:  Vitals:   07/19/17 1038  TempSrc: Oral      Patients Stated Pain Goal: 4 (07/19/17 1040)  Complications: No apparent anesthesia complications

## 2017-07-20 LAB — CBC
HCT: 44.1 % (ref 39.0–52.0)
HEMOGLOBIN: 15.9 g/dL (ref 13.0–17.0)
MCH: 31.5 pg (ref 26.0–34.0)
MCHC: 36.1 g/dL — AB (ref 30.0–36.0)
MCV: 87.5 fL (ref 78.0–100.0)
Platelets: 205 10*3/uL (ref 150–400)
RBC: 5.04 MIL/uL (ref 4.22–5.81)
RDW: 12.7 % (ref 11.5–15.5)
WBC: 16.5 10*3/uL — ABNORMAL HIGH (ref 4.0–10.5)

## 2017-07-20 LAB — BASIC METABOLIC PANEL
ANION GAP: 8 (ref 5–15)
BUN: 12 mg/dL (ref 6–20)
CALCIUM: 9 mg/dL (ref 8.9–10.3)
CO2: 24 mmol/L (ref 22–32)
CREATININE: 0.66 mg/dL (ref 0.61–1.24)
Chloride: 104 mmol/L (ref 101–111)
GFR calc non Af Amer: >60 mL/min (ref >60)
Glucose, Bld: 108 mg/dL — ABNORMAL HIGH (ref 65–99)
Potassium: 4.2 mmol/L (ref 3.5–5.1)
SODIUM: 136 mmol/L (ref 135–145)

## 2017-07-20 MED ORDER — SODIUM CHLORIDE 0.9 % IV BOLUS (SEPSIS)
250.0000 mL | Freq: Once | INTRAVENOUS | Status: AC
  Administered 2017-07-20: 250 mL via INTRAVENOUS

## 2017-07-20 NOTE — Clinical Social Work Note (Signed)
Clinical Social Work Assessment  Patient Details  Name: Bill [name] MRN: [id number] Date of Birth: 1956/10/25  Date of referral:  07/20/17               Reason for consult:  Discharge Planning                Permission sought to share information with:  Chartered certified accountant granted to share information::  Yes, Verbal Permission Granted  Name::        Agency::     Relationship::     Contact Information:     Housing/Transportation Living arrangements for the past 2 months:  Single Family Home Source of Information:  Patient Patient Interpreter Needed:  None Criminal Activity/Legal Involvement Pertinent to Current Situation/Hospitalization:  No - Comment as needed Significant Relationships:  Siblings, Friend Lives with:  Self Do you feel safe going back to the place where you live?   (SNF requested.) Need for family participation in patient care:  No (Coment)  Care giving concerns:  Pt requires more care than available at home following hospital dc.   Social Worker assessment / plan:  Pt hospitalized on 07/19/17 from home for pre planned right total knee arthroplasty. CSW met with pt at bedside to offer support and assistance with dc planning. Pt has made prior arrangements to have ST Rehab at [facility] ( [facility] ) at dc. CSW has contacted SNF and clinicals provided. SNF will request authorization from [facility] for SNF placement. [facility] will accept pt provided BCBS provides authorization for placement. Pt is aware of this. CSW will continue to follow to assist with d/c planning needs.  Employment status:  Therapist, music:  Managed Care PT Recommendations:  [place] / Referral to community resources:  [place]  Patient/Family's Response to care:  Pt feels Rehab placement is needed.  Patient/Family's Understanding of and Emotional Response to Diagnosis, Current Treatment, and Prognosis:  Pt met with MD this am to  review medical status. Pt is motivated to work with therapy and is hopeful that insurance will authorization ST Rehab at [facility].  Emotional Assessment Appearance:  Appears stated age Attitude/Demeanor/Rapport:  Other (cooperative) Affect (typically observed):  Appropriate, Pleasant Orientation:  Oriented to Self, Oriented to Place, Oriented to  Time, Oriented to Situation Alcohol / Substance use:  Not Applicable Psych involvement (Current and /or in the community):  No (Comment)  Discharge Needs  Concerns to be addressed:  Discharge Planning Concerns Readmission within the last 30 days:  No Current discharge risk:  None Barriers to Discharge:  No Barriers Identified   Luretha Rued, Alexander 07/20/2017, 9:55 AM

## 2017-07-20 NOTE — Anesthesia Postprocedure Evaluation (Signed)
Anesthesia Post Note  Patient: Bill [name]  Procedure(s) Performed: Procedure(s) (LRB): RIGHT TOTAL KNEE ARTHROPLASTY (Right)     Patient location during evaluation: PACU Anesthesia Type: Regional and Spinal Level of consciousness: awake and alert Pain management: pain level controlled Vital Signs Assessment: post-procedure vital signs reviewed and stable Respiratory status: spontaneous breathing and respiratory function stable Cardiovascular status: blood pressure returned to baseline and stable Postop Assessment: spinal receding Anesthetic complications: no    Last Vitals:  Vitals:   07/20/17 0122 07/20/17 0604  BP: 108/70 114/80  Pulse: 84 81  Resp: 16 18  Temp: 36.6 C 36.7 C    Last Pain:  Vitals:   07/20/17 0643  TempSrc:   PainSc: 2                  Lewie LoronJohn Linley Moskal

## 2017-07-20 NOTE — Progress Notes (Signed)
Physical Therapy Treatment Patient Details Name: Bill BaileyJerry W [name] MRN: [id number] DOB: 03/01/1956 Today's Date: 07/20/2017    History of Present Illness s/p R TKA    PT Comments    The patient is progressing in mobility. Requires cues for safety with RW as RLE tends to catch. Patient is wearing KI and his boots due to  Ankle injury.   Follow Up Recommendations  SNF;Supervision/Assistance - 24 hour     Equipment Recommendations  None recommended by PT    Recommendations for Other Services       Precautions / Restrictions Precautions Precautions: Knee;Fall Required Braces or Orthoses: Knee Immobilizer - Right Knee Immobilizer - Right: Discontinue once straight leg raise with < 10 degree lag    Mobility  Bed Mobility Overal bed mobility: Modified Independent                Transfers Overall transfer level: Needs assistance Equipment used: Rolling walker (2 wheeled) Transfers: Sit to/from Stand Sit to Stand: Min guard         General transfer comment: cues for  technique and safety,. Cues for hand and  right leg position.   Ambulation/Gait Ambulation/Gait assistance: Min assist Ambulation Distance (Feet): 80 Feet Assistive device: Rolling walker (2 wheeled) Gait Pattern/deviations: Step-to pattern;Step-through pattern Gait velocity: decreased   General Gait Details: multimodal cues for safety , sequence and posture. At times the right foot catches the floor and the right RW post. the patient reports that his gait pattern is different since Knee is now straight and not in valgus.   Stairs            Wheelchair Mobility    Modified Rankin (Stroke Patients Only)       Balance                                            Cognition Arousal/Alertness: Awake/alert                                            Exercises  reviewed SLR, heel slides ans Quad sets to perform later tonight.    General Comments         Pertinent Vitals/Pain Pain Score: 4  Pain Location: R knee Pain Descriptors / Indicators: Aching;Tightness Pain Intervention(s): Premedicated before session;Repositioned;Ice applied    Home Living                      Prior Function            PT Goals (current goals can now be found in the care plan section) Progress towards PT goals: Progressing toward goals    Frequency    7X/week      PT Plan Current plan remains appropriate    Co-evaluation              AM-PAC PT "6 Clicks" Daily Activity  Outcome Measure  Difficulty turning over in bed (including adjusting bedclothes, sheets and blankets)?: None Difficulty moving from lying on back to sitting on the side of the bed? : None Difficulty sitting down on and standing up from a chair with arms (e.g., wheelchair, bedside commode, etc,.)?: Total Help needed moving to and from a bed to chair (including a wheelchair)?: A  Little Help needed walking in hospital room?: A Little Help needed climbing 3-5 steps with a railing? : A Little 6 Click Score: 18    End of Session Equipment Utilized During Treatment: Right knee immobilizer Activity Tolerance: Patient tolerated treatment well Patient left: in bed;with call bell/phone within reach Nurse Communication: Mobility status PT Visit Diagnosis: Difficulty in walking, not elsewhere classified (R26.2);Pain Pain - Right/Left: Right Pain - part of body: Knee     Time: 1610-9604 PT Time Calculation (min) (ACUTE ONLY): 33 min  Charges:  $Gait Training: 23-37 mins                    G CodesBlanchard Kelch PT [phone]    Rada Hay 07/20/2017, 4:52 PM

## 2017-07-20 NOTE — Progress Notes (Signed)
Pt prefers self placement of CPAP.  Pt to notify RT if any assistance is required throughout the night.  RT to monitor and assess as needed.

## 2017-07-20 NOTE — Clinical Social Work Placement (Signed)
   CLINICAL SOCIAL WORK PLACEMENT  NOTE  Date:  07/20/2017  Patient Details  Name: Bill [name] MRN: [id number] Date of Birth: 09/27/1956  Clinical Social Work is seeking post-discharge placement for this patient at the Skilled  Nursing Facility level of care (*CSW will initial, date and re-position this form in  chart as items are completed):  Yes   Patient/family provided with [place] Clinical Social Work Department's list of facilities offering this level of care within the geographic area requested by the patient (or if unable, by the patient's family).  Yes   Patient/family informed of their freedom to choose among providers that offer the needed level of care, that participate in Medicare, Medicaid or managed care program needed by the patient, have an available bed and are willing to accept the patient.  Yes   Patient/family informed of [facility]'s ownership interest in [facility] and [facility], as well as of the fact that they are under no obligation to receive care at these facilities.  PASRR submitted to EDS on 07/27/17     PASRR number received on 07/20/17     Existing PASRR number confirmed on       FL2 transmitted to all facilities in geographic area requested by pt/family on 07/20/17     FL2 transmitted to all facilities within larger geographic area on       Patient informed that his/her managed care company has contracts with or will negotiate with certain facilities, including the following:        Yes   Patient/family informed of bed offers received.  Patient chooses bed at [facility], [place]     Physician recommends and patient chooses bed at      Patient to be transferred to   on  .  Patient to be transferred to facility by       Patient family notified on   of transfer.  Name of family member notified:        PHYSICIAN       Additional Comment:    _______________________________________________ Royetta AsalHaidinger, Finas Delone Lee, Alexander MtLCSW   [phone] 07/20/2017, 10:39 AM

## 2017-07-20 NOTE — Progress Notes (Signed)
   Subjective: 1 Day Post-Op Procedure(s) (LRB): RIGHT TOTAL KNEE ARTHROPLASTY (Right) Patient reports pain as mild and moderate.   Patient seen in rounds with Dr. Lequita HaltAluisio. Patient is well, but has had some minor complaints of pain in the knee, requiring pain medications We will start therapy today. HV drain still with some output so leave today. Plan is to go Skilled nursing facility after hospital stay.  Wants to look into [facility] [place].  Objective: Vital signs in last 24 hours: Temp:  [97.3 F (36.3 C)-98.1 F (36.7 C)] 98 F (36.7 C) (07/24 0604) Pulse Rate:  [61-94] 81 (07/24 0604) Resp:  [12-22] 18 (07/24 0604) BP: (102-133)/(61-90) 114/80 (07/24 0604) SpO2:  [98 %-100 %] 100 % (07/24 0604) Weight:  [79.8 kg (176 lb)] 79.8 kg (176 lb) (07/23 1040)  Intake/Output from previous day:  Intake/Output Summary (Last 24 hours) at 07/20/17 0854 Last data filed at 07/20/17 0846  Gross per 24 hour  Intake             3260 ml  Output             5090 ml  Net            -1830 ml    Intake/Output this shift: Total I/O In: 480 [P.O.:480] Out: -   Labs:  Recent Labs  07/19/17 1805 07/20/17 0529  HGB 15.9 15.9    Recent Labs  07/19/17 1805 07/20/17 0529  WBC 13.0* 16.5*  RBC 5.06 5.04  HCT 46.0 44.1  PLT 242 205    Recent Labs  07/19/17 1805 07/20/17 0529  NA 138 136  K 3.9 4.2  CL 102 104  CO2 28 24  BUN 14 12  CREATININE 0.69 0.66  GLUCOSE 138* 108*  CALCIUM 9.1 9.0    Recent Labs  07/19/17 1805  INR 0.97    EXAM General - Patient is Alert, Appropriate and Oriented Extremity - Neurovascular intact Sensation intact distally Intact pulses distally Dorsiflexion/Plantar flexion intact Dressing - dressing C/D/I Motor Function - intact, moving foot and toes well on exam. Good dorsiflexion and plantarflexion of ankle Hemovac left in place at this time.  (Still with some drainage and output - lateral approach).  Past Medical History:    Diagnosis Date  . Anginal pain (HCC)    hx of noted ER visit in 2001 pt had CXR preformed 01/30/2000 discussed CP issues pt states never had to followup with cardiologist denies further issues   . Arthritis   . Sleep apnea    uses CPAP    Assessment/Plan: 1 Day Post-Op Procedure(s) (LRB): RIGHT TOTAL KNEE ARTHROPLASTY (Right) Principal Problem:   OA (osteoarthritis) of knee  Estimated body mass index is 23.22 kg/m as calculated from the following:   Height as of this encounter: 6\' 1"  (1.854 m).   Weight as of this encounter: 79.8 kg (176 lb). Up with therapy Discharge to SNF  DVT Prophylaxis - Xarelto Weight-Bearing as tolerated to right leg D/C O2 and Pulse OX and try on Room Air Leave drain until tomorrow. Social worker consult.  Avel Peacerew Jaide Hillenburg, PA-C Orthopaedic Surgery 07/20/2017, 8:54 AM

## 2017-07-20 NOTE — NC FL2 (Signed)
[place] MEDICAID FL2 LEVEL OF CARE SCREENING TOOL     IDENTIFICATION  Patient Name: Bill BaileyJerry W Vanetten Birthdate: 11/16/1956 Sex: male Admission Date (Current Location): 07/19/2017  [facility] [place]   Facility and Address:  [facility],  [address], [place] [id number]      Provider Number: [id number]  Attending Physician Name and Address:  Ollen GrossAluisio, Lenardo Westwood, MD  Relative Name and Phone Number:       Current Level of Care: Hospital Recommended Level of Care:   Prior Approval Number:    Date Approved/Denied:   PASRR Number: [id number]  Discharge Plan: SNF    Current Diagnoses: Patient Active Problem List   Diagnosis Date Noted  . OA (osteoarthritis) of knee 07/19/2017  . OA (osteoarthritis) of hip 02/14/2015    Orientation RESPIRATION BLADDER Height & Weight     Self, Time, Situation, Place  Normal Continent Weight: 79.8 kg (176 lb) Height:  6\' 1"  (185.4 cm)  BEHAVIORAL SYMPTOMS/MOOD NEUROLOGICAL BOWEL NUTRITION STATUS  Other (Comment) (no behaviors)   Continent Diet  AMBULATORY STATUS COMMUNICATION OF NEEDS Skin   Extensive Assist Verbally Surgical wounds                       Personal Care Assistance Level of Assistance  Bathing, Feeding, Dressing Bathing Assistance: Limited assistance Feeding assistance: Independent Dressing Assistance: Limited assistance     Functional Limitations Info  Sight, Hearing, Speech Sight Info: Adequate Hearing Info: Adequate Speech Info: Adequate    SPECIAL CARE FACTORS FREQUENCY  PT (By licensed PT), OT (By licensed OT)     PT Frequency: 5x wk OT Frequency: 5x wk            Contractures Contractures Info: Not present    Additional Factors Info  Code Status Code Status Info: Full Code             Current Medications (07/20/2017):  This is the current hospital active medication list Current Facility-Administered Medications  Medication Dose Route Frequency Provider Last  Rate Last Dose  . 0.9 %  sodium chloride infusion   Intravenous Continuous Perkins, Alexzandrew L, PA-C 100 mL/hr at 07/19/17 2356    . acetaminophen (TYLENOL) tablet 650 mg  650 mg Oral Q6H PRN Rashan Patient, Homero FellersFrank, MD       Or  . acetaminophen (TYLENOL) suppository 650 mg  650 mg Rectal Q6H PRN Stacye Noori, Homero FellersFrank, MD      . acetaminophen (TYLENOL) tablet 1,000 mg  1,000 mg Oral Q6H Ollen GrossAluisio, Saundra Gin, MD   1,000 mg at 07/20/17 0543  . bisacodyl (DULCOLAX) suppository 10 mg  10 mg Rectal Daily PRN Ollen GrossAluisio, Arianie Couse, MD      . diphenhydrAMINE (BENADRYL) 12.5 MG/5ML elixir 12.5-25 mg  12.5-25 mg Oral Q4H PRN Daveyon Kitchings, Homero FellersFrank, MD      . docusate sodium (COLACE) capsule 100 mg  100 mg Oral BID Ollen GrossAluisio, Zonie Crutcher, MD   100 mg at 07/20/17 0921  . HYDROmorphone (DILAUDID) injection 0.25-0.5 mg  0.25-0.5 mg Intravenous Q5 min PRN Lewie LoronGermeroth, John, MD      . ketorolac (TORADOL) 30 MG/ML injection 30 mg  30 mg Intravenous Once PRN Lewie LoronGermeroth, John, MD      . menthol-cetylpyridinium (CEPACOL) lozenge 3 mg  1 lozenge Oral PRN Lacey Dotson, Homero FellersFrank, MD       Or  . phenol (CHLORASEPTIC) mouth spray 1 spray  1 spray Mouth/Throat PRN Ollen GrossAluisio, Xzavien Harada, MD      . meperidine (DEMEROL)  injection 6.25-12.5 mg  6.25-12.5 mg Intravenous Q5 min PRN Lewie Loron, MD      . methocarbamol (ROBAXIN) tablet 500 mg  500 mg Oral Q6H PRN Ollen Gross, MD   500 mg at 07/19/17 2122   Or  . methocarbamol (ROBAXIN) 500 mg in dextrose 5 % 50 mL IVPB  500 mg Intravenous Q6H PRN Ollen Gross, MD   Stopped at 07/19/17 1503  . metoCLOPramide (REGLAN) tablet 5-10 mg  5-10 mg Oral Q8H PRN Cristie Mckinney, Homero Fellers, MD       Or  . metoCLOPramide (REGLAN) injection 5-10 mg  5-10 mg Intravenous Q8H PRN Gryphon Vanderveen, Homero Fellers, MD      . morphine 4 MG/ML injection 1 mg  1 mg Intravenous Q2H PRN Jaedyn Lard, Homero Fellers, MD      . ondansetron (ZOFRAN) tablet 4 mg  4 mg Oral Q6H PRN Ollen Gross, MD       Or  . ondansetron (ZOFRAN) injection 4 mg  4 mg Intravenous Q6H PRN Shuan Statzer, Homero Fellers, MD       . oxyCODONE (Oxy IR/ROXICODONE) immediate release tablet 5-10 mg  5-10 mg Oral Q3H PRN Ollen Gross, MD   10 mg at 07/20/17 0920  . polyethylene glycol (MIRALAX / GLYCOLAX) packet 17 g  17 g Oral Daily PRN Allen Basista, Homero Fellers, MD      . promethazine (PHENERGAN) injection 6.25-12.5 mg  6.25-12.5 mg Intravenous Q15 min PRN Lewie Loron, MD      . rivaroxaban Carlena Hurl) tablet 10 mg  10 mg Oral Q breakfast Alegria Dominique, Homero Fellers, MD      . sodium chloride 0.9 % bolus 250 mL  250 mL Intravenous Once Perkins, Alexzandrew L, PA-C      . sodium phosphate (FLEET) 7-19 GM/118ML enema 1 enema  1 enema Rectal Once PRN Starlett Pehrson, Homero Fellers, MD      . traMADol Janean Sark) tablet 50-100 mg  50-100 mg Oral Q6H PRN Ollen Gross, MD         Discharge Medications: Please see discharge summary for a list of discharge medications.  Relevant Imaging Results:  Relevant Lab Results:   Additional Information SS # [id number]  Haidinger, Dickey Gave, LCSW

## 2017-07-20 NOTE — Evaluation (Signed)
Occupational Therapy Evaluation Patient Details Name: Bill [name] MRN: [id number] DOB: Nov 16, 1956 Today's Date: 07/20/2017    History of Present Illness s/p R TKA   Clinical Impression   This 61 year old man was admitted for the above sx. He needs mod to max A for LB adls at this time and min A to stand. Pt needed mod cueing this session for precautions and safety He will benefit from continued OT to increase safety and independence with adls.  Goals are for supervisioin level.      Follow Up Recommendations  SNF    Equipment Recommendations  3 in 1 bedside commode    Recommendations for Other Services       Precautions / Restrictions Precautions Precautions: Knee;Fall Precaution Comments: Pt did SLR but did not put a lot of weight on RLE when standign Required Braces or Orthoses: Knee Immobilizer - Right Restrictions Weight Bearing Restrictions: No      Mobility Bed Mobility Overal bed mobility: Needs Assistance Bed Mobility: Supine to Sit;Sit to Supine     Supine to sit: Min guard Sit to supine: Min guard   General bed mobility comments: for safety  Transfers Overall transfer level: Needs assistance Equipment used: Rolling walker (2 wheeled) Transfers: Sit to/from Stand Sit to Stand: Min assist         General transfer comment: assist to stand and steady.  Pt placed little weight on RLE.  Cues for UE placement and safety    Balance                                           ADL either performed or assessed with clinical judgement   ADL Overall ADL's : Needs assistance/impaired     Grooming: Wash/dry hands;Wash/dry face;Sitting;Set up   Upper Body Bathing: Set up;Sitting   Lower Body Bathing: Sit to/from stand;Moderate assistance   Upper Body Dressing : Set up;Sitting   Lower Body Dressing: Maximal assistance;Sit to/from stand (mod A for underwear; drain in place)                 General ADL Comments: performed ADL from  EOB and returned to supine. Did not review AE this visit, but pt could benefit from this.  Pt is very talkative and required mod cues during standing for weight bearing and keeping one hand on walker for safety.  Pt states he needs to have further sx on L ankle.  Reviewed knee precautions and cues to keep leg straight given at beginning and end of session     Vision         Perception     Praxis      Pertinent Vitals/Pain Pain Assessment: Faces Faces Pain Scale: Hurts little more Pain Location: R knee Pain Intervention(s): Limited activity within patient's tolerance;Monitored during session;Repositioned;Patient requesting pain meds-RN notified     Hand Dominance     Extremity/Trunk Assessment Upper Extremity Assessment Upper Extremity Assessment: Overall WFL for tasks assessed (h/o RCR sx)           Communication Communication Communication: No difficulties   Cognition Arousal/Alertness: Awake/alert Behavior During Therapy: WFL for tasks assessed/performed                                   General Comments: decreased safety:  pt  very talkative and distracts himself. Also needed reinforcement for knee precautions   General Comments  pt had posterior LOB when first standing. Encouraged keeping one hand on walker during standing ADL    Exercises     Shoulder Instructions      Home Living Family/patient expects to be discharged to:: Skilled nursing facility Living Arrangements: Alone                                      Prior Functioning/Environment Level of Independence: Independent                 OT Problem List: Decreased strength;Decreased activity tolerance;Impaired balance (sitting and/or standing);Pain;Decreased knowledge of use of DME or AE;Decreased safety awareness      OT Treatment/Interventions: Self-care/ADL training;DME and/or AE instruction;Patient/family education;Balance training    OT Goals(Current goals can  be found in the care plan section) Acute Rehab OT Goals Patient Stated Goal: be able to work in garden OT Goal Formulation: With patient Time For Goal Achievement: 07/27/17 Potential to Achieve Goals: Good ADL Goals Pt Will Perform Grooming: standing;with supervision Pt Will Perform Lower Body Bathing: sit to/from stand;with adaptive equipment;with supervision Pt Will Perform Lower Body Dressing: with supervision;sit to/from stand;with adaptive equipment Pt Will Transfer to Toilet: with supervision;ambulating;bedside commode Pt Will Perform Toileting - Clothing Manipulation and hygiene: with supervision;sit to/from stand Additional ADL Goal #1: pt will not need any safety cues nor cues for knee precautions during OT session  OT Frequency: Min 2X/week   Barriers to D/C:            Co-evaluation              AM-PAC PT "6 Clicks" Daily Activity     Outcome Measure Help from another person eating meals?: None Help from another person taking care of personal grooming?: A Little Help from another person toileting, which includes using toliet, bedpan, or urinal?: A Lot Help from another person bathing (including washing, rinsing, drying)?: A Lot Help from another person to put on and taking off regular upper body clothing?: A Little Help from another person to put on and taking off regular lower body clothing?: A Lot 6 Click Score: 16   End of Session    Activity Tolerance: Patient tolerated treatment well Patient left: in bed;with call bell/phone within reach  OT Visit Diagnosis: Unsteadiness on feet (R26.81);Pain Pain - Right/Left: Right Pain - part of body: Knee                Time: 0810-0840 OT Time Calculation (min): 30 min Charges:  OT General Charges $OT Visit: 1 Procedure OT Evaluation $OT Eval Low Complexity: 1 Procedure OT Treatments $Self Care/Home Management : 8-22 mins G-Codes:     Bill [name],  OTR/L [phone] 07/20/2017  Bill [name] 07/20/2017, 9:04 AM

## 2017-07-20 NOTE — Evaluation (Signed)
Physical Therapy Evaluation Patient Details Name: Bill [name] MRN: [id number] DOB: Jul 05, 1956 Today's Date: 07/20/2017   History of Present Illness  s/p R TKA  Clinical Impression  The patient requires extra time for mobility and exercises with frequent questions about recovery. The patient  Will benefit from Short term rehab. Pt admitted with above diagnosis. Pt currently with functional limitations due to the deficits listed below (see PT Problem List).  Pt will benefit from skilled PT to increase their independence and safety with mobility to allow discharge to the venue listed below.       Follow Up Recommendations SNF;Supervision/Assistance - 24 hour    Equipment Recommendations  None recommended by PT    Recommendations for Other Services       Precautions / Restrictions Precautions Precautions: Knee;Fall Precaution Comments: Pt did SLR but did not put a lot of weight on RLE when standign Required Braces or Orthoses: Knee Immobilizer - Right Knee Immobilizer - Right: Discontinue once straight leg raise with < 10 degree lag Restrictions Weight Bearing Restrictions: No      Mobility  Bed Mobility Overal bed mobility: Needs Assistance Bed Mobility: Supine to Sit;Sit to Supine     Supine to sit: Min guard Sit to supine: Min guard   General bed mobility comments: for safety, extra time to  self prepare to lift  right leg onto bed,  Transfers Overall transfer level: Needs assistance Equipment used: Rolling walker (2 wheeled) Transfers: Sit to/from Stand Sit to Stand: Min assist         General transfer comment: assist to stand and steady.  Pt placed little weight on RLE.  Cues for UE placement and safety  Ambulation/Gait Ambulation/Gait assistance: Min assist Ambulation Distance (Feet): 80 Feet Assistive device: Rolling walker (2 wheeled) Gait Pattern/deviations: Step-to pattern;Step-through pattern;Antalgic Gait velocity: decreased   General Gait Details:  multimodal cues for safety , sequence and posture, use of RW.    Stairs            Wheelchair Mobility    Modified Rankin (Stroke Patients Only)       Balance                                             Pertinent Vitals/Pain Pain Assessment: 0-10 Pain Score: 5  Faces Pain Scale: Hurts little more Pain Location: R knee Pain Descriptors / Indicators: Aching;Tightness Pain Intervention(s): Premedicated before session;Monitored during session;Ice applied;Repositioned    Home Living Family/patient expects to be discharged to:: Skilled nursing facility Living Arrangements: Alone                    Prior Function Level of Independence: Independent with assistive device(s)               Hand Dominance        Extremity/Trunk Assessment   Upper Extremity Assessment Upper Extremity Assessment: Defer to OT evaluation    Lower Extremity Assessment Lower Extremity Assessment: RLE deficits/detail RLE Deficits / Details: 10-60 knee flexion    Cervical / Trunk Assessment Cervical / Trunk Assessment: Normal  Communication   Communication: No difficulties  Cognition Arousal/Alertness: Awake/alert Behavior During Therapy: WFL for tasks assessed/performed Overall Cognitive Status: Within Functional Limits for tasks assessed  General Comments: decreased safety:  pt very talkative and distracts himself. Also needed reinforcement for knee precautions      General Comments General comments (skin integrity, edema, etc.): pt had posterior LOB when first standing. Encouraged keeping one hand on walker during standing ADL    Exercises Total Joint Exercises Ankle Circles/Pumps: AROM;Both;10 reps;Supine Quad Sets: AAROM;Both;10 reps;Supine Heel Slides: AAROM;Right;10 reps;Supine Hip ABduction/ADduction: AAROM;Right;15 reps;Supine Straight Leg Raises: AAROM;Right;10 reps;Supine   Assessment/Plan     PT Assessment Patient needs continued PT services  PT Problem List Decreased strength;Decreased range of motion;Decreased activity tolerance;Decreased mobility;Decreased knowledge of precautions;Decreased safety awareness;Decreased knowledge of use of DME;Pain       PT Treatment Interventions DME instruction;Gait training;Stair training;Patient/family education;Functional mobility training;Therapeutic activities;Therapeutic exercise    PT Goals (Current goals can be found in the Care Plan section)  Acute Rehab PT Goals Patient Stated Goal: be able to work in garden PT Goal Formulation: With patient Time For Goal Achievement: 07/27/17 Potential to Achieve Goals: Good    Frequency 7X/week   Barriers to discharge        Co-evaluation               AM-PAC PT "6 Clicks" Daily Activity  Outcome Measure Difficulty turning over in bed (including adjusting bedclothes, sheets and blankets)?: A Little Difficulty moving from lying on back to sitting on the side of the bed? : A Little Difficulty sitting down on and standing up from a chair with arms (e.g., wheelchair, bedside commode, etc,.)?: A Little Help needed moving to and from a bed to chair (including a wheelchair)?: A Lot Help needed walking in hospital room?: A Lot Help needed climbing 3-5 steps with a railing? : Total 6 Click Score: 14    End of Session Equipment Utilized During Treatment: Right knee immobilizer Activity Tolerance: Patient tolerated treatment well Patient left: in bed;with call bell/phone within reach Nurse Communication: Mobility status PT Visit Diagnosis: Difficulty in walking, not elsewhere classified (R26.2);Pain Pain - Right/Left: Right Pain - part of body: Knee    Time: [id number] PT Time Calculation (min) (ACUTE ONLY): 62 min   Charges:   PT Evaluation $PT Eval Moderate Complexity: 1 Procedure PT Treatments $Gait Training: 8-22 mins $Therapeutic Exercise: 8-22 mins $Self Care/Home  Management: 8-22   PT G CodesBlanchard Kelch:        Luiscarlos Kaczmarczyk PT [phone]   Rada HayHill, Jocelynne Duquette Elizabeth 07/20/2017, 11:26 AM

## 2017-07-21 ENCOUNTER — Encounter (HOSPITAL_COMMUNITY): Payer: Self-pay | Admitting: Orthopedic Surgery

## 2017-07-21 LAB — BASIC METABOLIC PANEL
ANION GAP: 8 (ref 5–15)
BUN: 12 mg/dL (ref 6–20)
CHLORIDE: 105 mmol/L (ref 101–111)
CO2: 24 mmol/L (ref 22–32)
Calcium: 8.9 mg/dL (ref 8.9–10.3)
Creatinine, Ser: 0.69 mg/dL (ref 0.61–1.24)
GFR calc Af Amer: >60 mL/min (ref >60)
GFR calc non Af Amer: >60 mL/min (ref >60)
GLUCOSE: 133 mg/dL — AB (ref 65–99)
POTASSIUM: 3.7 mmol/L (ref 3.5–5.1)
Sodium: 137 mmol/L (ref 135–145)

## 2017-07-21 LAB — CBC
HCT: 41.2 % (ref 39.0–52.0)
HEMOGLOBIN: 14.6 g/dL (ref 13.0–17.0)
MCH: 31.1 pg (ref 26.0–34.0)
MCHC: 35.4 g/dL (ref 30.0–36.0)
MCV: 87.8 fL (ref 78.0–100.0)
Platelets: 245 10*3/uL (ref 150–400)
RBC: 4.69 MIL/uL (ref 4.22–5.81)
RDW: 13 % (ref 11.5–15.5)
WBC: 13.5 10*3/uL — ABNORMAL HIGH (ref 4.0–10.5)

## 2017-07-21 NOTE — Progress Notes (Signed)
   Subjective: 2 Days Post-Op Procedure(s) (LRB): RIGHT TOTAL KNEE ARTHROPLASTY (Right) Patient reports pain as mild.   Patient seen in rounds by Dr. Lequita HaltAluisio. Patient is well, but has had some minor complaints of pain in the knee, requiring pain medications Plan is to go Skilled nursing facility after hospital stay.  Objective: Vital signs in last 24 hours: Temp:  [97.9 F (36.6 C)-98.8 F (37.1 C)] 98.8 F (37.1 C) (07/25 1418) Pulse Rate:  [82-91] 91 (07/25 1418) Resp:  [16-18] 16 (07/25 1418) BP: (110-131)/(50-72) 110/50 (07/25 1418) SpO2:  [100 %] 100 % (07/25 1418)  Intake/Output from previous day:  Intake/Output Summary (Last 24 hours) at 07/21/17 1551 Last data filed at 07/21/17 1418  Gross per 24 hour  Intake              890 ml  Output             3765 ml  Net            -2875 ml    Intake/Output this shift: Total I/O In: 480 [P.O.:480] Out: 1050 [Urine:1050]  Labs:  Recent Labs  07/19/17 1805 07/20/17 0529 07/21/17 0539  HGB 15.9 15.9 14.6    Recent Labs  07/20/17 0529 07/21/17 0539  WBC 16.5* 13.5*  RBC 5.04 4.69  HCT 44.1 41.2  PLT 205 245    Recent Labs  07/20/17 0529 07/21/17 0539  NA 136 137  K 4.2 3.7  CL 104 105  CO2 24 24  BUN 12 12  CREATININE 0.66 0.69  GLUCOSE 108* 133*  CALCIUM 9.0 8.9    Recent Labs  07/19/17 1805  INR 0.97    EXAM General - Patient is Alert, Appropriate and Oriented Extremity - Neurovascular intact Sensation intact distally Intact pulses distally Dorsiflexion/Plantar flexion intact Dressing/Incision - clean, healing Motor Function - intact, moving foot and toes well on exam.   Past Medical History:  Diagnosis Date  . Anginal pain (HCC)    hx of noted ER visit in 2001 pt had CXR preformed 01/30/2000 discussed CP issues pt states never had to followup with cardiologist denies further issues   . Arthritis   . Sleep apnea    uses CPAP    Assessment/Plan: 2 Days Post-Op Procedure(s)  (LRB): RIGHT TOTAL KNEE ARTHROPLASTY (Right) Principal Problem:   OA (osteoarthritis) of knee  Estimated body mass index is 23.22 kg/m as calculated from the following:   Height as of this encounter: 6\' 1"  (1.854 m).   Weight as of this encounter: 79.8 kg (176 lb). Up with therapy Plan for discharge tomorrow Discharge to SNF  DVT Prophylaxis - Xarelto Weight-Bearing as tolerated to right leg Plan for SNF tomorrow.  Avel Peacerew Ivoree Felmlee, PA-C Orthopaedic Surgery 07/21/2017, 3:51 PM

## 2017-07-21 NOTE — Progress Notes (Signed)
07/21/17 0500 Nursing  Patient drained a total of 80cc from hemovac drain. 7P to 7A last night. No place in regular charting to document this amount

## 2017-07-21 NOTE — Progress Notes (Signed)
Physical Therapy Treatment Patient Details Name: Bill BaileyJerry W Morocho MRN: [id number] DOB: 08/20/1956 Today's Date: 07/21/2017    History of Present Illness s/p R TKA    PT Comments    POD # 2 Pt c/o increased "soreness" today vs yesterday.  Required increased assist and decreased amb distance.  Noted still present with gait instability and delayed corrective reaction.  HIGH FALL RISK.  Also required increased VC's on proper gait sequencing and proper walker to self distance.  Progressing slolwy.  Pt will need ST Rehab at SNF before safely D/C to home.    Follow Up Recommendations  SNF;Supervision/Assistance - 24 hour     Equipment Recommendations  None recommended by PT    Recommendations for Other Services       Precautions / Restrictions Precautions Precautions: Knee;Fall Precaution Comments: instructed on KI use and proper R LE positioning full extension when sleeping/sitting long periods Knee Immobilizer - Right: Discontinue once straight leg raise with < 10 degree lag Restrictions Weight Bearing Restrictions: No Other Position/Activity Restrictions: WBAT    Mobility  Bed Mobility Overal bed mobility: Needs Assistance       Supine to sit: Min guard Sit to supine: Min assist   General bed mobility comments: assist to support R LE up onto bed due to increased c/o "soreness" today  Transfers Overall transfer level: Needs assistance Equipment used: Rolling walker (2 wheeled) Transfers: Sit to/from Stand Sit to Stand: Min guard;Min assist         General transfer comment: 50% VC's on proper hand placement esp with stand to sit as pt is impulsive plus 50% VC's on safety with turns/proper walker use.   Ambulation/Gait Ambulation/Gait assistance: Min assist Ambulation Distance (Feet): 42 Feet Assistive device: Rolling walker (2 wheeled) Gait Pattern/deviations: Step-to pattern;Step-through pattern;Decreased stance time - right;Trunk flexed Gait velocity: decreased    General Gait Details: decreased amb distance due to increased c/o "soreness" today > yesterday.  Very slow gait with delayed corrective reaction responce.  > unsteadiness and required increased VC's on proper walker to self distance and proper gait sequencing esp with backward gait to bed.  HIGH FALL RISK.    Stairs            Wheelchair Mobility    Modified Rankin (Stroke Patients Only)       Balance                                            Cognition Arousal/Alertness: Awake/alert Behavior During Therapy: WFL for tasks assessed/performed Overall Cognitive Status: Within Functional Limits for tasks assessed                                        Exercises      General Comments General comments (skin integrity, edema, etc.): reinforced knee precautions and importance of keeping leg straight when not exercising. Pt needs to work on extension      Pertinent Vitals/Pain Pain Assessment: 0-10 Pain Score: 4  Faces Pain Scale: Hurts a little bit Pain Location: R knee Pain Descriptors / Indicators: Aching;Tightness;Operative site guarding Pain Intervention(s): Monitored during session;Repositioned;Ice applied    Home Living                      Prior Function  PT Goals (current goals can now be found in the care plan section) Acute Rehab PT Goals Patient Stated Goal: be able to work in garden Progress towards PT goals: Progressing toward goals    Frequency    7X/week      PT Plan Current plan remains appropriate    Co-evaluation              AM-PAC PT "6 Clicks" Daily Activity  Outcome Measure  Difficulty turning over in bed (including adjusting bedclothes, sheets and blankets)?: Total Difficulty moving from lying on back to sitting on the side of the bed? : Total Difficulty sitting down on and standing up from a chair with arms (e.g., wheelchair, bedside commode, etc,.)?: Total Help needed  moving to and from a bed to chair (including a wheelchair)?: A Lot Help needed walking in hospital room?: A Lot Help needed climbing 3-5 steps with a railing? : Total 6 Click Score: 8    End of Session Equipment Utilized During Treatment: Right knee immobilizer Activity Tolerance: Patient limited by fatigue;Patient limited by pain Patient left: in bed;with call bell/phone within reach Nurse Communication: Mobility status PT Visit Diagnosis: Difficulty in walking, not elsewhere classified (R26.2);Pain     Time: 1610-96041007-1022 PT Time Calculation (min) (ACUTE ONLY): 15 min  Charges:  $Gait Training: 8-22 mins                    G Codes:       Felecia ShellingLori Maelee Hoot  PTA [facility]  Acute  Rehab Pager      [phone]

## 2017-07-21 NOTE — Care Management Note (Signed)
Case Management Note  Patient Details  Name: Brennan BaileyJerry W Brickman MRN: [id number] Date of Birth: 06/21/1956  Subjective/Objective: 61 y/o m admitted w/OA R knee. From home. PT-recc SN. CSW already following for SNF.                   Action/Plan:d/c SNF.   Expected Discharge Date:  07/21/17               Expected Discharge Plan:  Skilled Nursing Facility  In-House Referral:  Clinical Social Work  Discharge planning Services  CM Consult  Post Acute Care Choice:    Choice offered to:     DME Arranged:    DME Agency:     HH Arranged:    HH Agency:     Status of Service:  In process, will continue to follow  If discussed at Long Length of Stay Meetings, dates discussed:    Additional Comments:  Lanier ClamMahabir, Delainey Winstanley, RN 07/21/2017, 10:03 AM

## 2017-07-21 NOTE — Progress Notes (Signed)
Occupational Therapy Treatment Patient Details Name: Bill [name] MRN: [id number] DOB: 07/25/1956 Today's Date: 07/21/2017    History of present illness s/p R TKA   OT comments  Reinforced importance of knee precautions.  Worked on ADL with AE. Pt needs min A for this.  Follow Up Recommendations  SNF    Equipment Recommendations       Recommendations for Other Services      Precautions / Restrictions Precautions Precautions: Knee;Fall Precaution Comments: reinforced knee precautions:  upon entering room, pt had R knee bent up Knee Immobilizer - Right: Discontinue once straight leg raise with < 10 degree lag Restrictions Weight Bearing Restrictions: No       Mobility Bed Mobility         Supine to sit: Min guard     General bed mobility comments: extra time; for safety  Transfers Overall transfer level: Needs assistance Equipment used: Rolling walker (2 wheeled)   Sit to Stand: Min guard         General transfer comment: cues for LE placement    Balance                                           ADL either performed or assessed with clinical judgement   ADL       Grooming: Oral care;Supervision/safety;Standing       Lower Body Bathing: Sit to/from stand;Min guard       Lower Body Dressing: Minimal assistance;Sit to/from stand                 General ADL Comments: ambulated to bathroom:  min guard for safety.  Pt stood to urinate.  Pt is very thorough with activiities.  Used leg lifter to don KI with min A and cues.  Pt wears boots due to L ankle injury.  Used reacher and long shoehorn to assist with donning these with min A and extra time     Vision       Perception     Praxis      Cognition Arousal/Alertness: Awake/alert Behavior During Therapy: WFL for tasks assessed/performed Overall Cognitive Status: Within Functional Limits for tasks assessed                                           Exercises     Shoulder Instructions       General Comments reinforced knee precautions and importance of keeping leg straight when not exercising. Pt needs to work on extension    Pertinent Vitals/ Pain       Pain Assessment: Faces Faces Pain Scale: Hurts a little bit Pain Location: R knee Pain Intervention(s): Limited activity within patient's tolerance;Monitored during session;Premedicated before session;Repositioned  Home Living                                          Prior Functioning/Environment              Frequency  Min 2X/week        Progress Toward Goals  OT Goals(current goals can now be found in the care plan section)  Progress towards OT goals: Progressing toward goals  Acute Rehab OT Goals Patient Stated Goal: be able to work in garden Time For Goal Achievement: 07/27/17  Plan Discharge plan remains appropriate    Co-evaluation                 AM-PAC PT "6 Clicks" Daily Activity     Outcome Measure   Help from another person eating meals?: None Help from another person taking care of personal grooming?: A Little Help from another person toileting, which includes using toliet, bedpan, or urinal?: A Little Help from another person bathing (including washing, rinsing, drying)?: A Little Help from another person to put on and taking off regular upper body clothing?: A Little Help from another person to put on and taking off regular lower body clothing?: A Little 6 Click Score: 19    End of Session CPM Right Knee CPM Right Knee: Off  OT Visit Diagnosis: Unsteadiness on feet (R26.81);Pain Pain - Right/Left: Right Pain - part of body: Knee   Activity Tolerance Patient tolerated treatment well   Patient Left in chair;with call bell/phone within reach   Nurse Communication          Time: [id number] OT Time Calculation (min): 38 min  Charges: OT General Charges $OT Visit: 1 Procedure OT Treatments $Self  Care/Home Management : 23-37 mins $Therapeutic Activity: 8-22 mins  Bill [name], OTR/L [phone] 07/21/2017   Bill [name] 07/21/2017, 9:48 AM

## 2017-07-22 LAB — CBC
HEMATOCRIT: 39.7 % (ref 39.0–52.0)
HEMOGLOBIN: 14 g/dL (ref 13.0–17.0)
MCH: 30.9 pg (ref 26.0–34.0)
MCHC: 35.3 g/dL (ref 30.0–36.0)
MCV: 87.6 fL (ref 78.0–100.0)
Platelets: 261 10*3/uL (ref 150–400)
RBC: 4.53 MIL/uL (ref 4.22–5.81)
RDW: 13.1 % (ref 11.5–15.5)
WBC: 11 10*3/uL — ABNORMAL HIGH (ref 4.0–10.5)

## 2017-07-22 MED ORDER — RIVAROXABAN 10 MG PO TABS: 10.0000 mg | ORAL_TABLET | Freq: Every day | ORAL | 0 refills | Status: DC

## 2017-07-22 MED ORDER — BISACODYL 10 MG RE SUPP
10.0000 mg | Freq: Every day | RECTAL | 0 refills | Status: DC | PRN
Start: 2017-07-22 — End: 2019-02-01

## 2017-07-22 MED ORDER — POLYETHYLENE GLYCOL 3350 17 G PO PACK: 17.0000 g | PACK | Freq: Every day | ORAL | 0 refills | Status: DC | PRN

## 2017-07-22 MED ORDER — FLEET ENEMA 7-19 GM/118ML RE ENEM: 1.0000 | ENEMA | Freq: Once | RECTAL | 0 refills | Status: DC | PRN

## 2017-07-22 MED ORDER — OXYCODONE HCL 5 MG PO TABS: 5.0000 mg | ORAL_TABLET | ORAL | 0 refills | Status: DC | PRN

## 2017-07-22 MED ORDER — ONDANSETRON HCL 4 MG PO TABS: 4.0000 mg | ORAL_TABLET | Freq: Four times a day (QID) | ORAL | 0 refills | Status: DC | PRN

## 2017-07-22 MED ORDER — METHOCARBAMOL 500 MG PO TABS: 500.0000 mg | ORAL_TABLET | Freq: Four times a day (QID) | ORAL | 0 refills | Status: DC | PRN

## 2017-07-22 MED ORDER — TRAMADOL HCL 50 MG PO TABS: 50.0000 mg | ORAL_TABLET | Freq: Four times a day (QID) | ORAL | 0 refills | Status: DC | PRN

## 2017-07-22 MED ORDER — DOCUSATE SODIUM 100 MG PO CAPS: 100.0000 mg | ORAL_CAPSULE | Freq: Two times a day (BID) | ORAL | 0 refills | Status: DC

## 2017-07-22 MED ORDER — DIPHENHYDRAMINE HCL 12.5 MG/5ML PO ELIX: 12.5000 mg | ORAL_SOLUTION | ORAL | 0 refills | Status: DC | PRN

## 2017-07-22 NOTE — Care Management Note (Signed)
Case Management Note  Patient Details  Name: Bill [name] MRN: [id number] Date of Birth: 12/26/1956  Subjective/Objective:                    Action/Plan:d/c SNF.   Expected Discharge Date:  07/22/17               Expected Discharge Plan:  Skilled Nursing Facility  In-House Referral:  Clinical Social Work  Discharge planning Services  CM Consult  Post Acute Care Choice:    Choice offered to:     DME Arranged:    DME Agency:     HH Arranged:    HH Agency:     Status of Service:  Completed, signed off  If discussed at [employer] Meetings, dates discussed:    Additional Comments:  Lanier ClamMahabir, Kenae Lindquist, RN 07/22/2017, 12:49 PM

## 2017-07-22 NOTE — Progress Notes (Signed)
Attempted to call report to [employer] ([facility]). Was not able to get through.

## 2017-07-22 NOTE — Discharge Summary (Signed)
Physician Discharge Summary   Patient ID: Bill [name] MRN: [id number] DOB/AGE: [birth year] 61 y.o.  Admit date: 07/19/2017 Discharge date: 07/22/2017  Primary Diagnosis:  Osteoarthritis Right knee(s)  Admission Diagnoses:  Past Medical History:  Diagnosis Date  . Anginal pain (Bill [name])    hx of noted ER visit in 2001 pt had CXR preformed 01/30/2000 discussed CP issues pt states never had to followup with cardiologist denies further issues   . Arthritis   . Sleep apnea    uses CPAP   Discharge Diagnoses:   Principal Problem:   OA (osteoarthritis) of knee  Estimated body mass index is 23.22 kg/m as calculated from the following:   Height as of this encounter: 6' 1"  (1.854 m).   Weight as of this encounter: 79.8 kg (176 lb).  Procedure:  Procedure(s) (LRB): RIGHT TOTAL KNEE ARTHROPLASTY (Right)   Consults: None  HPI: [name] is a 61 y.o. year old male with end stage OA of his right knee with progressively worsening pain and dysfunction. He has constant pain, with activity and at rest and significant functional deficits with difficulties even with ADLs. He has had extensive non-op management including analgesics, injections of cortisone and viscosupplements, and home exercise program, but remains in significant pain with significant dysfunction. Radiographs show bone on bone arthritis lateral and patellofemoral with severe valgus deformity. He presents now for right Total Knee Arthroplasty.   Laboratory Data: Admission on 07/19/2017  Component Date Value Ref Range Status  . aPTT 07/19/2017 35  24 - 36 seconds Final  . WBC 07/19/2017 13.0* 4.0 - 10.5 K/uL Final  . RBC 07/19/2017 5.06  4.22 - 5.81 MIL/uL Final  . Hemoglobin 07/19/2017 15.9  13.0 - 17.0 g/dL Final  . HCT 07/19/2017 46.0  39.0 - 52.0 % Final  . MCV 07/19/2017 90.9  78.0 - 100.0 fL Final  . MCH 07/19/2017 31.4  26.0 - 34.0 pg Final  . MCHC 07/19/2017 34.6  30.0 - 36.0 g/dL Final  . RDW 07/19/2017 13.0  11.5 - 15.5 %  Final  . Platelets 07/19/2017 242  150 - 400 K/uL Final  . Sodium 07/19/2017 138  135 - 145 mmol/L Final  . Potassium 07/19/2017 3.9  3.5 - 5.1 mmol/L Final  . Chloride 07/19/2017 102  101 - 111 mmol/L Final  . CO2 07/19/2017 28  22 - 32 mmol/L Final  . Glucose, Bld 07/19/2017 138* 65 - 99 mg/dL Final  . BUN 07/19/2017 14  6 - 20 mg/dL Final  . Creatinine, Ser 07/19/2017 0.69  0.61 - 1.24 mg/dL Final  . Calcium 07/19/2017 9.1  8.9 - 10.3 mg/dL Final  . Total Protein 07/19/2017 7.0  6.5 - 8.1 g/dL Final  . Albumin 07/19/2017 4.1  3.5 - 5.0 g/dL Final  . AST 07/19/2017 27  15 - 41 U/L Final  . ALT 07/19/2017 17  17 - 63 U/L Final  . Alkaline Phosphatase 07/19/2017 64  38 - 126 U/L Final  . Total Bilirubin 07/19/2017 0.8  0.3 - 1.2 mg/dL Final  . GFR calc non Af Amer 07/19/2017 >60  >60 mL/min Final  . GFR calc Af Amer 07/19/2017 >60  >60 mL/min Final   Comment: (NOTE) The eGFR has been calculated using the CKD EPI equation. This calculation has not been validated in all clinical situations. eGFR's persistently <60 mL/min signify possible Chronic Kidney Disease.   . Anion gap 07/19/2017 8  5 - 15 Final  . Prothrombin Time 07/19/2017 12.9  11.4 - 15.2 seconds Final  . INR 07/19/2017 0.97   Final  . WBC 07/20/2017 16.5* 4.0 - 10.5 K/uL Final  . RBC 07/20/2017 5.04  4.22 - 5.81 MIL/uL Final  . Hemoglobin 07/20/2017 15.9  13.0 - 17.0 g/dL Final  . HCT 07/20/2017 44.1  39.0 - 52.0 % Final  . MCV 07/20/2017 87.5  78.0 - 100.0 fL Final  . MCH 07/20/2017 31.5  26.0 - 34.0 pg Final  . MCHC 07/20/2017 36.1* 30.0 - 36.0 g/dL Final  . RDW 07/20/2017 12.7  11.5 - 15.5 % Final  . Platelets 07/20/2017 205  150 - 400 K/uL Final  . Sodium 07/20/2017 136  135 - 145 mmol/L Final  . Potassium 07/20/2017 4.2  3.5 - 5.1 mmol/L Final  . Chloride 07/20/2017 104  101 - 111 mmol/L Final  . CO2 07/20/2017 24  22 - 32 mmol/L Final  . Glucose, Bld 07/20/2017 108* 65 - 99 mg/dL Final  . BUN 07/20/2017 12  6  - 20 mg/dL Final  . Creatinine, Ser 07/20/2017 0.66  0.61 - 1.24 mg/dL Final  . Calcium 07/20/2017 9.0  8.9 - 10.3 mg/dL Final  . GFR calc non Af Amer 07/20/2017 >60  >60 mL/min Final  . GFR calc Af Amer 07/20/2017 >60  >60 mL/min Final   Comment: (NOTE) The eGFR has been calculated using the CKD EPI equation. This calculation has not been validated in all clinical situations. eGFR's persistently <60 mL/min signify possible Chronic Kidney Disease.   . Anion gap 07/20/2017 8  5 - 15 Final  . WBC 07/21/2017 13.5* 4.0 - 10.5 K/uL Final  . RBC 07/21/2017 4.69  4.22 - 5.81 MIL/uL Final  . Hemoglobin 07/21/2017 14.6  13.0 - 17.0 g/dL Final  . HCT 07/21/2017 41.2  39.0 - 52.0 % Final  . MCV 07/21/2017 87.8  78.0 - 100.0 fL Final  . MCH 07/21/2017 31.1  26.0 - 34.0 pg Final  . MCHC 07/21/2017 35.4  30.0 - 36.0 g/dL Final  . RDW 07/21/2017 13.0  11.5 - 15.5 % Final  . Platelets 07/21/2017 245  150 - 400 K/uL Final  . Sodium 07/21/2017 137  135 - 145 mmol/L Final  . Potassium 07/21/2017 3.7  3.5 - 5.1 mmol/L Final  . Chloride 07/21/2017 105  101 - 111 mmol/L Final  . CO2 07/21/2017 24  22 - 32 mmol/L Final  . Glucose, Bld 07/21/2017 133* 65 - 99 mg/dL Final  . BUN 07/21/2017 12  6 - 20 mg/dL Final  . Creatinine, Ser 07/21/2017 0.69  0.61 - 1.24 mg/dL Final  . Calcium 07/21/2017 8.9  8.9 - 10.3 mg/dL Final  . GFR calc non Af Amer 07/21/2017 >60  >60 mL/min Final  . GFR calc Af Amer 07/21/2017 >60  >60 mL/min Final   Comment: (NOTE) The eGFR has been calculated using the CKD EPI equation. This calculation has not been validated in all clinical situations. eGFR's persistently <60 mL/min signify possible Chronic Kidney Disease.   . Anion gap 07/21/2017 8  5 - 15 Final  . WBC 07/22/2017 11.0* 4.0 - 10.5 K/uL Final  . RBC 07/22/2017 4.53  4.22 - 5.81 MIL/uL Final  . Hemoglobin 07/22/2017 14.0  13.0 - 17.0 g/dL Final  . HCT 07/22/2017 39.7  39.0 - 52.0 % Final  . MCV 07/22/2017 87.6  78.0 -  100.0 fL Final  . MCH 07/22/2017 30.9  26.0 - 34.0 pg Final  . MCHC 07/22/2017 35.3  30.0 -  36.0 g/dL Final  . RDW 07/22/2017 13.1  11.5 - 15.5 % Final  . Platelets 07/22/2017 261  150 - 400 K/uL Final  Hospital Outpatient Visit on 07/14/2017  Component Date Value Ref Range Status  . aPTT 07/14/2017 35  24 - 36 seconds Final  . WBC 07/14/2017 5.9  4.0 - 10.5 K/uL Final  . RBC 07/14/2017 4.93  4.22 - 5.81 MIL/uL Final  . Hemoglobin 07/14/2017 15.5  13.0 - 17.0 g/dL Final  . HCT 07/14/2017 44.2  39.0 - 52.0 % Final  . MCV 07/14/2017 89.7  78.0 - 100.0 fL Final  . MCH 07/14/2017 31.4  26.0 - 34.0 pg Final  . MCHC 07/14/2017 35.1  30.0 - 36.0 g/dL Final  . RDW 07/14/2017 12.8  11.5 - 15.5 % Final  . Platelets 07/14/2017 220  150 - 400 K/uL Final  . Sodium 07/14/2017 136  135 - 145 mmol/L Final  . Potassium 07/14/2017 4.0  3.5 - 5.1 mmol/L Final  . Chloride 07/14/2017 102  101 - 111 mmol/L Final  . CO2 07/14/2017 26  22 - 32 mmol/L Final  . Glucose, Bld 07/14/2017 92  65 - 99 mg/dL Final  . BUN 07/14/2017 20  6 - 20 mg/dL Final  . Creatinine, Ser 07/14/2017 0.86  0.61 - 1.24 mg/dL Final  . Calcium 07/14/2017 9.2  8.9 - 10.3 mg/dL Final  . Total Protein 07/14/2017 6.7  6.5 - 8.1 g/dL Final  . Albumin 07/14/2017 4.1  3.5 - 5.0 g/dL Final  . AST 07/14/2017 22  15 - 41 U/L Final  . ALT 07/14/2017 17  17 - 63 U/L Final  . Alkaline Phosphatase 07/14/2017 59  38 - 126 U/L Final  . Total Bilirubin 07/14/2017 0.8  0.3 - 1.2 mg/dL Final  . GFR calc non Af Amer 07/14/2017 >60  >60 mL/min Final  . GFR calc Af Amer 07/14/2017 >60  >60 mL/min Final   Comment: (NOTE) The eGFR has been calculated using the CKD EPI equation. This calculation has not been validated in all clinical situations. eGFR's persistently <60 mL/min signify possible Chronic Kidney Disease.   . Anion gap 07/14/2017 8  5 - 15 Final  . Prothrombin Time 07/14/2017 12.8  11.4 - 15.2 seconds Final  . INR 07/14/2017 0.96   Final    . ABO/RH(D) 07/14/2017 A NEG   Final  . Antibody Screen 07/14/2017 NEG   Final  . Sample Expiration 07/14/2017 07/22/2017   Final  . Extend sample reason 07/14/2017 NO TRANSFUSIONS OR PREGNANCY IN THE PAST 3 MONTHS   Final  . MRSA, PCR 07/14/2017 NEGATIVE  NEGATIVE Final  . Staphylococcus aureus 07/14/2017 NEGATIVE  NEGATIVE Final   Comment:        The Xpert SA Assay (FDA approved for NASAL specimens in patients over 58 years of age), is one component of a comprehensive surveillance program.  Test performance has been validated by [facility] for patients greater than or equal to 42 year old. It is not intended to diagnose infection nor to guide or monitor treatment.      X-Rays:No results found.  EKG:No orders found for this or any previous visit.   Hospital Course: BENJI POYNTER is a 61 y.o. who was admitted to [facility]. They were brought to the operating room on 07/19/2017 and underwent Procedure(s): RIGHT TOTAL KNEE ARTHROPLASTY.  Patient tolerated the procedure well and was later transferred to the recovery room and then to the orthopaedic floor  for postoperative care.  They were given PO and IV analgesics for pain control following their surgery.  They were given 24 hours of postoperative antibiotics of  Anti-infectives    Start     Dose/Rate Route Frequency Ordered Stop   07/19/17 1830  ceFAZolin (ANCEF) IVPB 2g/100 mL premix     2 g 200 mL/hr over 30 Minutes Intravenous Every 6 hours 07/19/17 1651 07/20/17 0028   07/19/17 1108  ceFAZolin (ANCEF) IVPB 2g/100 mL premix  Status:  Discontinued     2 g 200 mL/hr over 30 Minutes Intravenous On call to O.R. 07/19/17 1108 07/19/17 1115   07/19/17 1009  ceFAZolin (ANCEF) 2-4 GM/100ML-% IVPB    Comments:  Waldron Session   : cabinet override      07/19/17 1009 07/19/17 1235   07/19/17 1004  ceFAZolin (ANCEF) IVPB 2g/100 mL premix     2 g 200 mL/hr over 30 Minutes Intravenous On call to O.R. 07/19/17 1004 07/19/17 1245      and started on DVT prophylaxis in the form of Xarelto.   PT and OT were ordered for total joint protocol.  Discharge planning consulted to help with postop disposition and equipment needs.  Social worker consulted to assist with placement on the patient.  Patient had a decent night on the evening of surgery.  They started to get up OOB with therapy on day one. Hemovac drain was pulled without difficulty.  Continued to work with therapy into day two.  Dressing was changed on day two and the incision was healing well.  By day three, the patient had progressed with therapy and meeting their goals.  Incision was healing well.  Patient was seen in rounds by Dr. Wynelle Link and was ready to go home.  Discharge to SNF Diet - Cardiac diet Follow up - in 2 weeks Activity - WBAT Disposition - Home Condition Upon Discharge - Stable D/C Meds - See DC Summary DVT Prophylaxis - Xarelto   Discharge Instructions    Call MD / Call 911    Complete by:  As directed    If you experience chest pain or shortness of breath, CALL 911 and be transported to the hospital emergency room.  If you develope a fever above 101 F, pus (white drainage) or increased drainage or redness at the wound, or calf pain, call your surgeon's office.   Change dressing    Complete by:  As directed    Change dressing daily with sterile 4 x 4 inch gauze dressing and apply TED hose. Do not submerge the incision under water.   Constipation Prevention    Complete by:  As directed    Drink plenty of fluids.  Prune juice may be helpful.  You may use a stool softener, such as Colace (over the counter) 100 mg twice a day.  Use MiraLax (over the counter) for constipation as needed.   Diet - low sodium heart healthy    Complete by:  As directed    Discharge instructions    Complete by:  As directed    Take Xarelto for two and a half more weeks, then discontinue Xarelto. Once the patient has completed the blood thinner regimen, then take a Baby  81 mg Aspirin daily for three more weeks.   Pick up stool softner and laxative for home use following surgery while on pain medications. Do not submerge incision under water. Please use good hand washing techniques while changing dressing each day. May shower starting  three days after surgery. Please use a clean towel to pat the incision dry following showers. Continue to use ice for pain and swelling after surgery. Do not use any lotions or creams on the incision until instructed by your surgeon.  Wear both TED hose on both legs during the day every day for three weeks, but may remove the TED hose at night at home.  Postoperative Constipation Protocol  Constipation - defined medically as fewer than three stools per week and severe constipation as less than one stool per week.  One of the most common issues patients have following surgery is constipation.  Even if you have a regular bowel pattern at home, your normal regimen is likely to be disrupted due to multiple reasons following surgery.  Combination of anesthesia, postoperative narcotics, change in appetite and fluid intake all can affect your bowels.  In order to avoid complications following surgery, here are some recommendations in order to help you during your recovery period.  Colace (docusate) - Pick up an over-the-counter form of Colace or another stool softener and take twice a day as long as you are requiring postoperative pain medications.  Take with a full glass of water daily.  If you experience loose stools or diarrhea, hold the colace until you stool forms back up.  If your symptoms do not get better within 1 week or if they get worse, check with your doctor.  Dulcolax (bisacodyl) - Pick up over-the-counter and take as directed by the product packaging as needed to assist with the movement of your bowels.  Take with a full glass of water.  Use this product as needed if not relieved by Colace only.   MiraLax (polyethylene  glycol) - Pick up over-the-counter to have on hand.  MiraLax is a solution that will increase the amount of water in your bowels to assist with bowel movements.  Take as directed and can mix with a glass of water, juice, soda, coffee, or tea.  Take if you go more than two days without a movement. Do not use MiraLax more than once per day. Call your doctor if you are still constipated or irregular after using this medication for 7 days in a row.  If you continue to have problems with postoperative constipation, please contact the office for further assistance and recommendations.  If you experience "the worst abdominal pain ever" or develop nausea or vomiting, please contact the office immediatly for further recommendations for treatment.   Do not put a pillow under the knee. Place it under the heel.    Complete by:  As directed    Do not sit on low chairs, stoools or toilet seats, as it may be difficult to get up from low surfaces    Complete by:  As directed    Driving restrictions    Complete by:  As directed    No driving until released by the physician.   Increase activity slowly as tolerated    Complete by:  As directed    Lifting restrictions    Complete by:  As directed    No lifting until released by the physician.   Patient may shower    Complete by:  As directed    You may shower without a dressing once there is no drainage.  Do not wash over the wound.  If drainage remains, do not shower until drainage stops.   TED hose    Complete by:  As directed    Use  stockings (TED hose) for 3 weeks on both leg(s).  You may remove them at night for sleeping.   Weight bearing as tolerated    Complete by:  As directed    Laterality:  right   Extremity:  Lower     Allergies as of 07/22/2017   No Known Allergies     Medication List    STOP taking these medications   glucosamine-chondroitin 500-400 MG tablet   testosterone cypionate 200 MG/ML injection Commonly known as:   DEPOTESTOSTERONE CYPIONATE     TAKE these medications   acetaminophen 325 MG tablet Commonly known as:  TYLENOL Take 2 tablets (650 mg total) by mouth every 6 (six) hours as needed for mild pain (or Fever >/= 101).   bisacodyl 10 MG suppository Commonly known as:  DULCOLAX Place 1 suppository (10 mg total) rectally daily as needed for moderate constipation.   diphenhydrAMINE 12.5 MG/5ML elixir Commonly known as:  BENADRYL Take 5-10 mLs (12.5-25 mg total) by mouth every 4 (four) hours as needed for itching.   docusate sodium 100 MG capsule Commonly known as:  COLACE Take 1 capsule (100 mg total) by mouth 2 (two) times daily.   methocarbamol 500 MG tablet Commonly known as:  ROBAXIN Take 1 tablet (500 mg total) by mouth every 6 (six) hours as needed for muscle spasms.   mineral oil-hydrophilic petrolatum ointment Apply 1 application topically 2 (two) times daily as needed for dry skin (on feet).   ondansetron 4 MG tablet Commonly known as:  ZOFRAN Take 1 tablet (4 mg total) by mouth every 6 (six) hours as needed for nausea.   oxyCODONE 5 MG immediate release tablet Commonly known as:  Oxy IR/ROXICODONE Take 1-2 tablets (5-10 mg total) by mouth every 4 (four) hours as needed for moderate pain or severe pain.   polyethylene glycol packet Commonly known as:  MIRALAX / GLYCOLAX Take 17 g by mouth daily as needed for mild constipation.   rivaroxaban 10 MG Tabs tablet Commonly known as:  XARELTO Take 1 tablet (10 mg total) by mouth daily with breakfast. Take Xarelto for two and a half more weeks following discharge from the hospital, then discontinue Xarelto. Once the patient has completed the blood thinner regimen, then take a Baby 81 mg Aspirin daily for three more weeks.   sodium phosphate 7-19 GM/118ML Enem Place 133 mLs (1 enema total) rectally once as needed for severe constipation.   traMADol 50 MG tablet Commonly known as:  ULTRAM Take 1-2 tablets (50-100 mg total) by  mouth every 6 (six) hours as needed for moderate pain.       Contact information for follow-up providers    Gaynelle Arabian, MD. Schedule an appointment as soon as possible for a visit on 08/03/2017.   Specialty:  Orthopedic Surgery Contact information: [address] [place] [address] [phone]            Contact information for after-discharge care    Destination    [facility] Follow up.   Specialty:  Skilled Nursing Facility Contact information: [place] [phone]                  Signed: Arlee Muslim, PA-C Orthopaedic Surgery 07/22/2017, 9:12 AM

## 2017-07-22 NOTE — Clinical Social Work Placement (Signed)
   CLINICAL SOCIAL WORK PLACEMENT  NOTE  Date:  07/22/2017  Patient Details  Name: Bill [name] MRN: [id number] Date of Birth: 07/04/1956  Clinical Social Work is seeking post-discharge placement for this patient at the Skilled  Nursing Facility level of care (*CSW will initial, date and re-position this form in  chart as items are completed):  Yes   Patient/family provided with [facility] Clinical Social Work Department's list of facilities offering this level of care within the geographic area requested by the patient (or if unable, by the patient's family).  Yes   Patient/family informed of their freedom to choose among providers that offer the needed level of care, that participate in Medicare, Medicaid or managed care program needed by the patient, have an available bed and are willing to accept the patient.  Yes   Patient/family informed of [facility] ownership interest [facility] and [facility], as well as of the fact that they are under no obligation to receive care at these facilities.  PASRR submitted to EDS on 07/27/17     PASRR number received on 07/20/17     Existing PASRR number confirmed on       FL2 transmitted to all facilities in geographic area requested by pt/family on 07/20/17     FL2 transmitted to all facilities within larger geographic area on       Patient informed that his/her managed care company has contracts with or will negotiate with certain facilities, including the following:        Yes   Patient/family informed of bed offers received.  Patient chooses bed at [facility] [place]     Physician recommends and patient chooses bed at      Patient to be transferred to [facility] [place] on 07/22/17.  Patient to be transferred to facility by [facility]     Patient family notified on 07/22/17 of transfer.  Name of family member notified:  SISTER     PHYSICIAN       Additional Comment: Pt is in agreement with dc to [facility]  today. [employer] has provided authorization for SNF placement. Pt requesting to transport by sister. DC Summary sent to SNF for review. Scripts included in [facility]. DC packet provided to pt. # for report provided to nsg.   _______________________________________________ Royetta AsalHaidinger, Anes Rigel Lee, LCSW  [phone] 07/22/2017, 10:19 AM

## 2017-07-22 NOTE — Progress Notes (Signed)
   Subjective: 3 Days Post-Op Procedure(s) (LRB): RIGHT TOTAL KNEE ARTHROPLASTY (Right) Patient reports pain as mild.   Patient seen in rounds with Dr. Lequita HaltAluisio.  Sitting up in bed doing his exercises and working on his extension. Patient is well, and has had no acute complaints or problems Patient is ready to go to the SNF - [facility] [place].  Objective: Vital signs in last 24 hours: Temp:  [98.1 F (36.7 C)-99 F (37.2 C)] 98.1 F (36.7 C) (07/26 0543) Pulse Rate:  [91-100] 100 (07/26 0543) Resp:  [16-17] 17 (07/26 0543) BP: (102-118)/(50-64) 118/64 (07/26 0543) SpO2:  [98 %-100 %] 98 % (07/26 0543)  Intake/Output from previous day:  Intake/Output Summary (Last 24 hours) at 07/22/17 0905 Last data filed at 07/22/17 0801  Gross per 24 hour  Intake              480 ml  Output             2575 ml  Net            -2095 ml    Intake/Output this shift: Total I/O In: -  Out: 250 [Urine:250]  Labs:  Recent Labs  07/19/17 1805 07/20/17 0529 07/21/17 0539 07/22/17 0542  HGB 15.9 15.9 14.6 14.0    Recent Labs  07/21/17 0539 07/22/17 0542  WBC 13.5* 11.0*  RBC 4.69 4.53  HCT 41.2 39.7  PLT 245 261    Recent Labs  07/20/17 0529 07/21/17 0539  NA 136 137  K 4.2 3.7  CL 104 105  CO2 24 24  BUN 12 12  CREATININE 0.66 0.69  GLUCOSE 108* 133*  CALCIUM 9.0 8.9    Recent Labs  07/19/17 1805  INR 0.97    EXAM: General - Patient is Alert, Appropriate and Oriented Extremity - Neurovascular intact Sensation intact distally Intact pulses distally Dorsiflexion/Plantar flexion intact Incision - clean, dry, no drainage Motor Function - intact, moving foot and toes well on exam.   Assessment/Plan: 3 Days Post-Op Procedure(s) (LRB): RIGHT TOTAL KNEE ARTHROPLASTY (Right) Procedure(s) (LRB): RIGHT TOTAL KNEE ARTHROPLASTY (Right) Past Medical History:  Diagnosis Date  . Anginal pain (HCC)    hx of noted ER visit in 2001 pt had CXR preformed 01/30/2000  discussed CP issues pt states never had to followup with cardiologist denies further issues   . Arthritis   . Sleep apnea    uses CPAP   Principal Problem:   OA (osteoarthritis) of knee  Estimated body mass index is 23.22 kg/m as calculated from the following:   Height as of this encounter: 6\' 1"  (1.854 m).   Weight as of this encounter: 79.8 kg (176 lb). Up with therapy Discharge to SNF Diet - Cardiac diet Follow up - in 2 weeks Activity - WBAT Disposition - Home Condition Upon Discharge - Stable D/C Meds - See DC Summary DVT Prophylaxis - Xarelto  Avel Peacerew Sakib Noguez, PA-C Orthopaedic Surgery 07/22/2017, 9:05 AM

## 2017-07-22 NOTE — Progress Notes (Signed)
Physical Therapy Treatment Patient Details Name: Bill [name] MRN: [id number] DOB: 08/15/1956 Today's Date: 07/22/2017    History of Present Illness s/p R TKA    PT Comments    POD # 3 Applied KI and instructed on use.  Assisted with then performed all supine TKR TE's followed by ICE.    Follow Up Recommendations  SNF;Supervision/Assistance - 24 hour     Equipment Recommendations  None recommended by PT    Recommendations for Other Services       Precautions / Restrictions Precautions Precautions: Knee;Fall Required Braces or Orthoses: Knee Immobilizer - Right Knee Immobilizer - Right: Discontinue once straight leg raise with < 10 degree lag Restrictions Weight Bearing Restrictions: No Other Position/Activity Restrictions: WBAT    Mobility  Bed Mobility Overal bed mobility: Needs Assistance Bed Mobility: Supine to Sit;Sit to Supine     Supine to sit: Min guard Sit to supine: Min assist   General bed mobility comments: assist to support R LE up onto bed due to increased c/o "soreness" today  Transfers Overall transfer level: Needs assistance Equipment used: Rolling walker (2 wheeled) Transfers: Sit to/from Stand Sit to Stand: Min guard;Min assist         General transfer comment: 50% VC's on proper hand placement esp with stand to sit as pt is impulsive plus 50% VC's on safety with turns/proper walker use.   Ambulation/Gait Ambulation/Gait assistance: Min assist Ambulation Distance (Feet): 55 Feet Assistive device: Rolling walker (2 wheeled) Gait Pattern/deviations: Step-to pattern;Step-through pattern;Decreased stance time - right;Trunk flexed Gait velocity: decreased   General Gait Details: 25% VC's   Stairs            Wheelchair Mobility    Modified Rankin (Stroke Patients Only)       Balance                                            Cognition Arousal/Alertness: Awake/alert Behavior During Therapy: WFL for tasks  assessed/performed Overall Cognitive Status: Within Functional Limits for tasks assessed                                        Exercises   Total Knee Replacement TE's 10 reps B LE ankle pumps 10 reps towel squeezes 10 reps knee presses 10 reps heel slides  10 reps SAQ's 10 reps SLR's 10 reps ABD Followed by ICE     General Comments        Pertinent Vitals/Pain Pain Assessment: 0-10 Pain Score: 5  Pain Location: R knee Pain Descriptors / Indicators: Aching;Tightness;Operative site guarding Pain Intervention(s): Monitored during session;Premedicated before session;Repositioned;Ice applied    Home Living                      Prior Function            PT Goals (current goals can now be found in the care plan section) Progress towards PT goals: Progressing toward goals    Frequency    7X/week      PT Plan Current plan remains appropriate    Co-evaluation              AM-PAC PT "6 Clicks" Daily Activity  Outcome Measure  Difficulty turning over in bed (including adjusting  bedclothes, sheets and blankets)?: Total Difficulty moving from lying on back to sitting on the side of the bed? : Total Difficulty sitting down on and standing up from a chair with arms (e.g., wheelchair, bedside commode, etc,.)?: Total Help needed moving to and from a bed to chair (including a wheelchair)?: A Lot Help needed walking in hospital room?: A Lot Help needed climbing 3-5 steps with a railing? : Total 6 Click Score: 8    End of Session Equipment Utilized During Treatment: Right knee immobilizer Activity Tolerance: Patient limited by fatigue;Patient limited by pain Patient left: in bed;with call bell/phone within reach Nurse Communication: Mobility status PT Visit Diagnosis: Difficulty in walking, not elsewhere classified (R26.2);Pain     Time: [id number] PT Time Calculation (min) (ACUTE ONLY): 45 min  Charges:  $Gait Training: 8-22  mins $Therapeutic Exercise: 8-22 mins $Therapeutic Activity: 8-22 mins                    G Codes:       {Ralph Benavidez  PTA [facility]  Acute  Rehab Pager      [phone]

## 2018-09-29 DIAGNOSIS — G4733 Obstructive sleep apnea (adult) (pediatric): Secondary | ICD-10-CM | POA: Diagnosis not present

## 2018-10-06 DIAGNOSIS — B351 Tinea unguium: Secondary | ICD-10-CM | POA: Diagnosis not present

## 2018-10-06 DIAGNOSIS — I872 Venous insufficiency (chronic) (peripheral): Secondary | ICD-10-CM | POA: Diagnosis not present

## 2018-11-17 DIAGNOSIS — H6061 Unspecified chronic otitis externa, right ear: Secondary | ICD-10-CM | POA: Diagnosis not present

## 2018-11-17 DIAGNOSIS — J342 Deviated nasal septum: Secondary | ICD-10-CM | POA: Diagnosis not present

## 2018-11-17 DIAGNOSIS — H903 Sensorineural hearing loss, bilateral: Secondary | ICD-10-CM | POA: Diagnosis not present

## 2018-11-17 DIAGNOSIS — H6121 Impacted cerumen, right ear: Secondary | ICD-10-CM | POA: Diagnosis not present

## 2018-12-02 DIAGNOSIS — H90A22 Sensorineural hearing loss, unilateral, left ear, with restricted hearing on the contralateral side: Secondary | ICD-10-CM | POA: Diagnosis not present

## 2018-12-02 DIAGNOSIS — H6061 Unspecified chronic otitis externa, right ear: Secondary | ICD-10-CM | POA: Diagnosis not present

## 2018-12-02 DIAGNOSIS — H90A31 Mixed conductive and sensorineural hearing loss, unilateral, right ear with restricted hearing on the contralateral side: Secondary | ICD-10-CM | POA: Diagnosis not present

## 2018-12-02 DIAGNOSIS — J342 Deviated nasal septum: Secondary | ICD-10-CM | POA: Diagnosis not present

## 2018-12-02 DIAGNOSIS — H903 Sensorineural hearing loss, bilateral: Secondary | ICD-10-CM | POA: Diagnosis not present

## 2019-01-03 DIAGNOSIS — Z1211 Encounter for screening for malignant neoplasm of colon: Secondary | ICD-10-CM | POA: Diagnosis not present

## 2019-01-03 DIAGNOSIS — Z1389 Encounter for screening for other disorder: Secondary | ICD-10-CM | POA: Diagnosis not present

## 2019-01-03 DIAGNOSIS — I444 Left anterior fascicular block: Secondary | ICD-10-CM | POA: Diagnosis not present

## 2019-01-03 DIAGNOSIS — Z125 Encounter for screening for malignant neoplasm of prostate: Secondary | ICD-10-CM | POA: Diagnosis not present

## 2019-01-03 DIAGNOSIS — Z8619 Personal history of other infectious and parasitic diseases: Secondary | ICD-10-CM | POA: Diagnosis not present

## 2019-01-03 DIAGNOSIS — N400 Enlarged prostate without lower urinary tract symptoms: Secondary | ICD-10-CM | POA: Diagnosis not present

## 2019-01-03 DIAGNOSIS — G4733 Obstructive sleep apnea (adult) (pediatric): Secondary | ICD-10-CM | POA: Diagnosis not present

## 2019-01-03 DIAGNOSIS — Z0000 Encounter for general adult medical examination without abnormal findings: Secondary | ICD-10-CM | POA: Diagnosis not present

## 2019-01-03 DIAGNOSIS — Z13220 Encounter for screening for lipoid disorders: Secondary | ICD-10-CM | POA: Diagnosis not present

## 2019-01-03 DIAGNOSIS — B351 Tinea unguium: Secondary | ICD-10-CM | POA: Diagnosis not present

## 2019-01-03 DIAGNOSIS — Z131 Encounter for screening for diabetes mellitus: Secondary | ICD-10-CM | POA: Diagnosis not present

## 2019-01-03 DIAGNOSIS — E291 Testicular hypofunction: Secondary | ICD-10-CM | POA: Diagnosis not present

## 2019-01-20 DIAGNOSIS — M25512 Pain in left shoulder: Secondary | ICD-10-CM | POA: Diagnosis not present

## 2019-01-20 DIAGNOSIS — M19011 Primary osteoarthritis, right shoulder: Secondary | ICD-10-CM | POA: Diagnosis not present

## 2019-02-01 DIAGNOSIS — I444 Left anterior fascicular block: Secondary | ICD-10-CM | POA: Insufficient documentation

## 2019-02-01 DIAGNOSIS — R7303 Prediabetes: Secondary | ICD-10-CM | POA: Insufficient documentation

## 2019-02-01 DIAGNOSIS — G4733 Obstructive sleep apnea (adult) (pediatric): Secondary | ICD-10-CM | POA: Insufficient documentation

## 2019-02-01 DIAGNOSIS — N400 Enlarged prostate without lower urinary tract symptoms: Secondary | ICD-10-CM | POA: Insufficient documentation

## 2019-02-05 NOTE — Progress Notes (Signed)
PCP: Tally JoeSwayne, Tempestt Silba, MD  Clinic Note: No chief complaint on file.   ZOX:WRUEAHPI:Bill [name] is a 63 y.o. male who is being seen today for the evaluation of LEFT ANTERIOR FASCICULAR BLOCK ON EKG at the request of Tally JoeSwayne, Johnni Wunschel, MD.  Brennan BaileyJerry W [name] was referred by Tally JoeSwayne, Emylia Latella, MD for evaluation after being seen for routine annual physical -- EKG done showed LAFB -- had been several years since last EKG (previous PCP).. Pt. Denies any cardiac Sx.   Recent Hospitalizations: none  Studies Personally Reviewed - (if available, images/films reviewed: From Epic Chart or Care Everywhere)  none  Interval History: Bill SorrowJerry presents here for cardiology evaluation denying any cardiac symptoms.  He is relatively active, only limited by ankle pain from his longstanding orthopedic issues.  He denies any resting or exertional dyspnea.  No chest pain or pressure with rest or exertion. No PND, orthopnea or edema. No palpitations, lightheadedness, dizziness, weakness or syncope/near syncope. No TIA/amaurosis fugax symptoms. No melena, hematochezia, hematuria, or epstaxis. No claudication.  ROS: A comprehensive was performed. Review of Systems  Constitutional: Negative for malaise/fatigue and weight loss.  HENT: Negative for congestion and nosebleeds.   Respiratory: Negative for cough, shortness of breath and wheezing.   Gastrointestinal: Negative for blood in stool, constipation, heartburn and melena.  Genitourinary: Negative for hematuria.  Musculoskeletal: Positive for joint pain (L ankle - chronic pain -- s/p injury with several Sgx. ).  Skin:       Mild chronic venous stasis dermatitis on L ankle  Neurological: Negative for dizziness, weakness and headaches.  Psychiatric/Behavioral: Negative.   All other systems reviewed and are negative.    I have reviewed and (if needed) personally updated the patient's problem list, medications, allergies, past medical and surgical history, social and family history.     Past Medical History:  Diagnosis Date  . Anginal pain (HCC)    hx of noted ER visit in 2001 pt had CXR preformed 01/30/2000 discussed CP issues pt states never had to followup with cardiologist denies further issues   . Arthritis   . Carpal tunnel syndrome   . Low testosterone   . Obstructive sleep apnea   . Onychomycosis   . Prediabetes   . Sleep apnea    uses CPAP    Past Surgical History:  Procedure Laterality Date  . broken left foot      1988 secondary to fall had to have surgical intervention has hardware  . colonscopy     . NASAL SEPTUM SURGERY    . Right total knne arthroplasty     07/19/17  Dr. Lequita HaltAluisio  . ROTATOR CUFF REPAIR     Left 02/11/16  . TOTAL HIP ARTHROPLASTY Right 02/14/2015   Procedure: RIGHT TOTAL HIP ARTHROPLASTY ANTERIOR APPROACH;  Surgeon: Loanne DrillingFrank Aluisio V, MD;  Location: [facility];  Service: Orthopedics;  Laterality: Right;  . TOTAL KNEE ARTHROPLASTY Right 07/19/2017   Procedure: RIGHT TOTAL KNEE ARTHROPLASTY;  Surgeon: Ollen GrossAluisio, Frank, MD;  Location: [facility];  Service: Orthopedics;  Laterality: Right;  Adductor Block    Current Meds  Medication Sig  . acetaminophen (TYLENOL) 500 MG tablet Take 1,000 mg by mouth every 12 (twelve) hours as needed (for cold sores).  . Misc Natural Products (GLUCOSAMINE CHOND COMPLEX/MSM) TABS Take by mouth. As directed  . Naproxen (NAPROSYN PO) Take 220 mg by mouth 2 (two) times daily as needed.  . NON FORMULARY as directed. CPAP  . testosterone cypionate (DEPOTESTOSTERONE CYPIONATE) 200 MG/ML injection  Inject 100 mg into the muscle once a week. For 30 days  . traMADol (ULTRAM) 50 MG tablet Take 50 mg by mouth every 6 (six) hours as needed.    No Known Allergies  Social History   Tobacco Use  . Smoking status: Never Smoker  . Smokeless tobacco: Never Used  Substance Use Topics  . Alcohol use: No  . Drug use: No   Social History   Social History Narrative  . Not on file    family history includes Breast cancer in  his sister; Diabetes in his sister; Heart Problems in his mother.  Wt Readings from Last 3 Encounters:  02/06/19 190 lb (86.2 kg)  07/19/17 176 lb (79.8 kg)  07/14/17 176 lb (79.8 kg)    PHYSICAL EXAM BP 130/72   Pulse 81   Ht 6\' 1"  (1.854 m)   Wt 190 lb (86.2 kg)   BMI 25.07 kg/m  Physical Exam  Constitutional: He appears well-developed and well-nourished. No distress.  Pleasant, health appearing.  Well groomed.  HENT:  Head: Normocephalic and atraumatic.  Mouth/Throat: Oropharynx is clear and moist.  Eyes: Pupils are equal, round, and reactive to light. Conjunctivae and EOM are normal. No scleral icterus.  Neck: Normal range of motion. Neck supple. No hepatojugular reflux and no JVD present. Carotid bruit is not present. No tracheal deviation present. No thyromegaly present.  Cardiovascular: Normal rate, normal heart sounds and normal pulses.  No extrasystoles are present. PMI is not displaced. Exam reveals no gallop and no friction rub.  No murmur heard. Vitals reviewed.    Adult ECG Report  Rate: 81 ;  Rhythm: normal sinus rhythm and LAFB (-76).  Borderline incomplete RBBB; pulmonary pattern.  Narrative Interpretation: borderline EKG   Other studies Reviewed: Additional studies/ records that were reviewed today include:  Recent Labs: Labs from PCP reviewed.   ASSESSMENT / PLAN:  Patient with diagnosis of LAFB on EKG that is probably a long/chronic finding on his EKGs.  This was the first EKG checked by his new PCP. Cardiac risk factors include OSA and prediabetes.  LAFB is usually a sign of ischemic territory if acute. We will evaluate cardiac function with echocardiogram --if normal, he can simply follow-up on an as-needed basis..  We talked about baseline screening with coronary calcium score, but in the absence of significant risk factors, will hold off on doing this for now.  He will think about it.   Problem List Items Addressed This Visit    Left anterior  fascicular block - Primary   Relevant Orders   EKG 12-Lead   ECHOCARDIOGRAM COMPLETE   OSA (obstructive sleep apnea)   Relevant Orders   EKG 12-Lead   ECHOCARDIOGRAM COMPLETE   Prediabetes      I spent a total of with the patient and chart review. >  50% of the time was spent in direct patient consultation.   Current medicines are reviewed at length with the patient today.  (+/- concerns) -none The following changes have been made:  None  Patient Instructions  Medication Instructions:  Your physician recommends that you continue on your current medications as directed. Please refer to the Current Medication list given to you today.  If you need a refill on your cardiac medications before your next appointment, please call your pharmacy.   Lab work: None  If you have labs (blood work) drawn today and your tests are completely normal, you will receive your results only by: Marland Kitchen MyChart  Message (if you have MyChart) OR . A paper copy in the mail If you have any lab test that is abnormal or we need to change your treatment, we will call you to review the results.  Testing/Procedures: Your physician has requested that you have an echocardiogram. Echocardiography is a painless test that uses sound waves to create images of your heart. It provides your doctor with information about the size and shape of your heart and how well your heart's chambers and valves are working. This procedure takes approximately one hour. There are no restrictions for this procedure.  Follow-Up: At [facility], you and your health needs are our priority.  As part of our continuing mission to provide you with exceptional heart care, we have created designated Provider Care Teams.  These Care Teams include your primary Cardiologist (physician) and Advanced Practice Providers (APPs -  Physician Assistants and Nurse Practitioners) who all work together to provide you with the care you need, when you need  it. You will need a follow up appointment as needed with Dr. Herbie BaltimoreHarding.   Advanced Practice Providers on your designated Care Team:   Theodore DemarkRhonda Barrett, PA-C . Joni ReiningKathryn Lawrence, DNP, ANP  Any Other Special Instructions Will Be Listed Below (If Applicable). None   Studies Ordered:   Orders Placed This Encounter  Procedures  . EKG 12-Lead  . ECHOCARDIOGRAM COMPLETE      Bryan Lemmaavid Sabastian Raimondi, M.D., M.S. Interventional Cardiologist   Pager # [phone] Phone # [phone] [address] [place], [place] [address]   Thank you for choosing [facility] at [facility]!!

## 2019-02-06 ENCOUNTER — Ambulatory Visit: Payer: PPO | Admitting: Cardiology

## 2019-02-06 ENCOUNTER — Encounter (INDEPENDENT_AMBULATORY_CARE_PROVIDER_SITE_OTHER): Payer: Self-pay

## 2019-02-06 ENCOUNTER — Encounter: Payer: Self-pay | Admitting: Cardiology

## 2019-02-06 VITALS — BP 130/72 | HR 81 | Ht 73.0 in | Wt 190.0 lb

## 2019-02-06 DIAGNOSIS — R7303 Prediabetes: Secondary | ICD-10-CM | POA: Diagnosis not present

## 2019-02-06 DIAGNOSIS — I444 Left anterior fascicular block: Secondary | ICD-10-CM

## 2019-02-06 DIAGNOSIS — G4733 Obstructive sleep apnea (adult) (pediatric): Secondary | ICD-10-CM | POA: Diagnosis not present

## 2019-02-06 NOTE — Patient Instructions (Signed)
Medication Instructions:  Your physician recommends that you continue on your current medications as directed. Please refer to the Current Medication list given to you today.  If you need a refill on your cardiac medications before your next appointment, please call your pharmacy.   Lab work: None  If you have labs (blood work) drawn today and your tests are completely normal, you will receive your results only by: Marland Kitchen MyChart Message (if you have MyChart) OR . A paper copy in the mail If you have any lab test that is abnormal or we need to change your treatment, we will call you to review the results.  Testing/Procedures: Your physician has requested that you have an echocardiogram. Echocardiography is a painless test that uses sound waves to create images of your heart. It provides your doctor with information about the size and shape of your heart and how well your heart's chambers and valves are working. This procedure takes approximately one hour. There are no restrictions for this procedure.  Follow-Up: At [facility], you and your health needs are our priority.  As part of our continuing mission to provide you with exceptional heart care, we have created designated Provider Care Teams.  These Care Teams include your primary Cardiologist (physician) and Advanced Practice Providers (APPs -  Physician Assistants and Nurse Practitioners) who all work together to provide you with the care you need, when you need it. You will need a follow up appointment as needed with Dr. Herbie Baltimore.   Advanced Practice Providers on your designated Care Team:   Theodore Demark, PA-C . Joni Reining, DNP, ANP  Any Other Special Instructions Will Be Listed Below (If Applicable). None

## 2019-02-16 ENCOUNTER — Other Ambulatory Visit (HOSPITAL_COMMUNITY): Payer: PPO

## 2019-02-23 ENCOUNTER — Ambulatory Visit (HOSPITAL_COMMUNITY): Payer: PPO | Attending: Internal Medicine

## 2019-02-23 DIAGNOSIS — I444 Left anterior fascicular block: Secondary | ICD-10-CM | POA: Insufficient documentation

## 2019-02-23 DIAGNOSIS — G4733 Obstructive sleep apnea (adult) (pediatric): Secondary | ICD-10-CM | POA: Insufficient documentation

## 2019-03-02 DIAGNOSIS — Z96641 Presence of right artificial hip joint: Secondary | ICD-10-CM | POA: Diagnosis not present

## 2019-03-02 DIAGNOSIS — Z471 Aftercare following joint replacement surgery: Secondary | ICD-10-CM | POA: Diagnosis not present

## 2019-03-02 DIAGNOSIS — Z96651 Presence of right artificial knee joint: Secondary | ICD-10-CM | POA: Diagnosis not present

## 2019-10-03 DIAGNOSIS — G4733 Obstructive sleep apnea (adult) (pediatric): Secondary | ICD-10-CM | POA: Diagnosis not present

## 2019-10-26 DIAGNOSIS — Z538 Procedure and treatment not carried out for other reasons: Secondary | ICD-10-CM | POA: Diagnosis not present

## 2019-10-26 DIAGNOSIS — Z1211 Encounter for screening for malignant neoplasm of colon: Secondary | ICD-10-CM | POA: Diagnosis not present

## 2019-11-14 DIAGNOSIS — Z1211 Encounter for screening for malignant neoplasm of colon: Secondary | ICD-10-CM | POA: Diagnosis not present

## 2020-01-19 DIAGNOSIS — N400 Enlarged prostate without lower urinary tract symptoms: Secondary | ICD-10-CM | POA: Diagnosis not present

## 2020-01-19 DIAGNOSIS — G4733 Obstructive sleep apnea (adult) (pediatric): Secondary | ICD-10-CM | POA: Diagnosis not present

## 2020-01-19 DIAGNOSIS — Z125 Encounter for screening for malignant neoplasm of prostate: Secondary | ICD-10-CM | POA: Diagnosis not present

## 2020-01-19 DIAGNOSIS — Z1211 Encounter for screening for malignant neoplasm of colon: Secondary | ICD-10-CM | POA: Diagnosis not present

## 2020-01-19 DIAGNOSIS — Z1389 Encounter for screening for other disorder: Secondary | ICD-10-CM | POA: Diagnosis not present

## 2020-01-19 DIAGNOSIS — B351 Tinea unguium: Secondary | ICD-10-CM | POA: Diagnosis not present

## 2020-01-19 DIAGNOSIS — Z8619 Personal history of other infectious and parasitic diseases: Secondary | ICD-10-CM | POA: Diagnosis not present

## 2020-01-19 DIAGNOSIS — Z0000 Encounter for general adult medical examination without abnormal findings: Secondary | ICD-10-CM | POA: Diagnosis not present

## 2020-01-19 DIAGNOSIS — I444 Left anterior fascicular block: Secondary | ICD-10-CM | POA: Diagnosis not present

## 2020-01-19 DIAGNOSIS — E291 Testicular hypofunction: Secondary | ICD-10-CM | POA: Diagnosis not present

## 2020-01-31 DIAGNOSIS — M25511 Pain in right shoulder: Secondary | ICD-10-CM | POA: Diagnosis not present

## 2020-01-31 DIAGNOSIS — M25512 Pain in left shoulder: Secondary | ICD-10-CM | POA: Diagnosis not present

## 2020-04-06 ENCOUNTER — Ambulatory Visit: Payer: PPO | Attending: Internal Medicine

## 2020-04-06 DIAGNOSIS — Z23 Encounter for immunization: Secondary | ICD-10-CM

## 2020-04-06 NOTE — Progress Notes (Signed)
   Covid-19 Vaccination Clinic  Name:  CASHMERE HARMES    MRN: [id number] DOB: 15-Dec-1956  04/06/2020  Mr. Cowher was observed post Covid-19 immunization for 15 minutes without incident. He was provided with Vaccine Information Sheet and instruction to access the V-Safe system.   Mr. Girvan was instructed to call 911 with any severe reactions post vaccine: Marland Kitchen Difficulty breathing  . Swelling of face and throat  . A fast heartbeat  . A bad rash all over body  . Dizziness and weakness   Immunizations Administered    Name Date Dose VIS Date Route   Pfizer COVID-19 Vaccine 04/06/2020  3:48 PM 0.3 mL 12/08/2019 Intramuscular   Manufacturer: [employer], [employer]   Lot: [id number]   NDC: [address]-2

## 2020-04-29 ENCOUNTER — Ambulatory Visit: Payer: PPO

## 2020-10-03 DIAGNOSIS — G4733 Obstructive sleep apnea (adult) (pediatric): Secondary | ICD-10-CM | POA: Diagnosis not present

## 2021-01-27 DIAGNOSIS — R7309 Other abnormal glucose: Secondary | ICD-10-CM | POA: Diagnosis not present

## 2021-01-27 DIAGNOSIS — Z136 Encounter for screening for cardiovascular disorders: Secondary | ICD-10-CM | POA: Diagnosis not present

## 2021-01-27 DIAGNOSIS — Z13220 Encounter for screening for lipoid disorders: Secondary | ICD-10-CM | POA: Diagnosis not present

## 2021-01-27 DIAGNOSIS — R030 Elevated blood-pressure reading, without diagnosis of hypertension: Secondary | ICD-10-CM | POA: Diagnosis not present

## 2021-01-27 DIAGNOSIS — E291 Testicular hypofunction: Secondary | ICD-10-CM | POA: Diagnosis not present

## 2021-01-27 DIAGNOSIS — Z23 Encounter for immunization: Secondary | ICD-10-CM | POA: Diagnosis not present

## 2021-01-27 DIAGNOSIS — R7303 Prediabetes: Secondary | ICD-10-CM | POA: Diagnosis not present

## 2021-01-27 DIAGNOSIS — Z8619 Personal history of other infectious and parasitic diseases: Secondary | ICD-10-CM | POA: Diagnosis not present

## 2021-01-27 DIAGNOSIS — H9190 Unspecified hearing loss, unspecified ear: Secondary | ICD-10-CM | POA: Diagnosis not present

## 2021-01-27 DIAGNOSIS — Z0000 Encounter for general adult medical examination without abnormal findings: Secondary | ICD-10-CM | POA: Diagnosis not present

## 2021-01-27 DIAGNOSIS — Z1389 Encounter for screening for other disorder: Secondary | ICD-10-CM | POA: Diagnosis not present

## 2021-01-27 DIAGNOSIS — Z125 Encounter for screening for malignant neoplasm of prostate: Secondary | ICD-10-CM | POA: Diagnosis not present

## 2021-03-12 DIAGNOSIS — M25512 Pain in left shoulder: Secondary | ICD-10-CM | POA: Diagnosis not present

## 2021-03-12 DIAGNOSIS — M25511 Pain in right shoulder: Secondary | ICD-10-CM | POA: Diagnosis not present

## 2021-09-02 DIAGNOSIS — Z8616 Personal history of COVID-19: Secondary | ICD-10-CM | POA: Diagnosis not present

## 2021-09-02 DIAGNOSIS — R1011 Right upper quadrant pain: Secondary | ICD-10-CM | POA: Diagnosis not present

## 2021-09-02 DIAGNOSIS — R030 Elevated blood-pressure reading, without diagnosis of hypertension: Secondary | ICD-10-CM | POA: Diagnosis not present

## 2021-09-03 ENCOUNTER — Other Ambulatory Visit: Payer: Self-pay | Admitting: Family Medicine

## 2021-09-03 DIAGNOSIS — R1011 Right upper quadrant pain: Secondary | ICD-10-CM

## 2021-09-16 ENCOUNTER — Ambulatory Visit
Admission: RE | Admit: 2021-09-16 | Discharge: 2021-09-16 | Disposition: A | Discharge status: Home / Self Care | Payer: PPO | Source: Ambulatory Visit | Attending: Family Medicine | Admitting: Family Medicine

## 2021-09-16 DIAGNOSIS — R1011 Right upper quadrant pain: Secondary | ICD-10-CM | POA: Diagnosis not present

## 2021-10-14 ENCOUNTER — Ambulatory Visit (INDEPENDENT_AMBULATORY_CARE_PROVIDER_SITE_OTHER): Payer: PPO | Admitting: Otolaryngology

## 2021-10-21 DIAGNOSIS — G4733 Obstructive sleep apnea (adult) (pediatric): Secondary | ICD-10-CM | POA: Diagnosis not present

## 2021-11-01 DIAGNOSIS — G4733 Obstructive sleep apnea (adult) (pediatric): Secondary | ICD-10-CM | POA: Diagnosis not present

## 2021-11-16 IMAGING — US US ABDOMEN LIMITED
1 series · 14 of 25 positions shown · non-contrast
Comparison: None.

CLINICAL DATA: Right upper quadrant abdominal pain

EXAM:
ULTRASOUND ABDOMEN LIMITED RIGHT UPPER QUADRANT

[Series 1: us abdomen limited · 0.20mm/px · 14 of 57 slices shown]
[im 1/57]
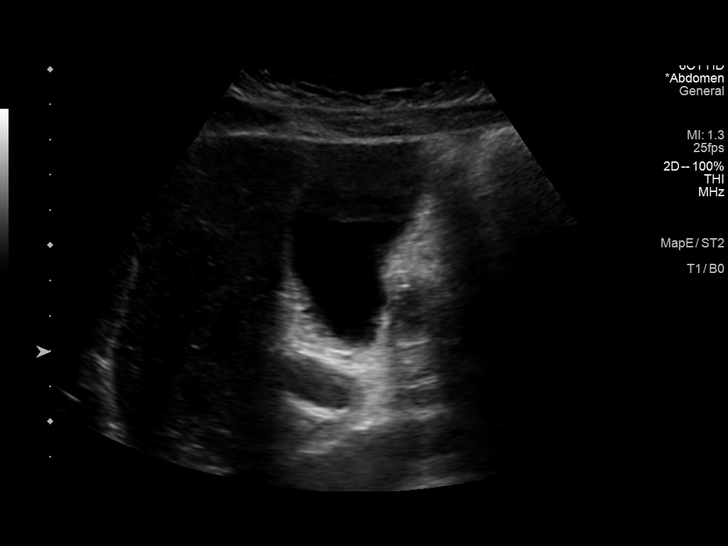
[im 5/57]
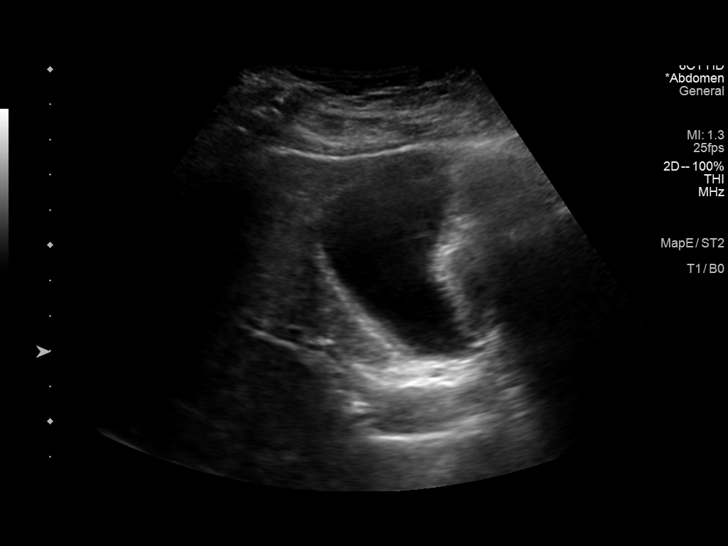
[im 10/57]
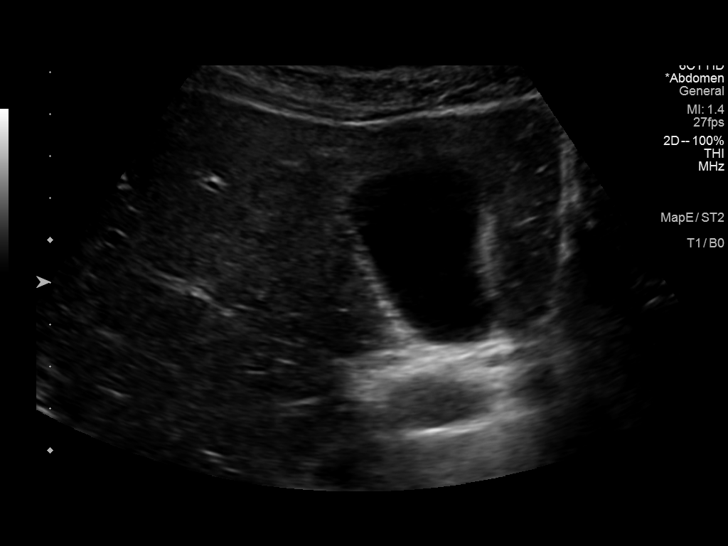
[im 15/57]
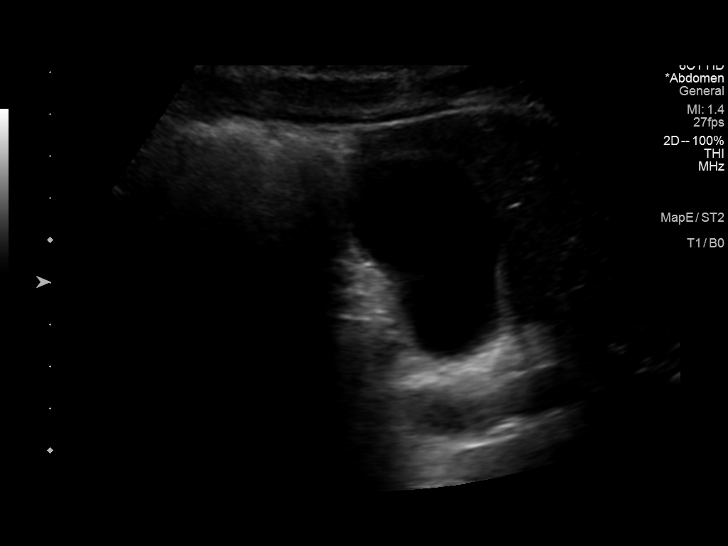
[im 19/57]
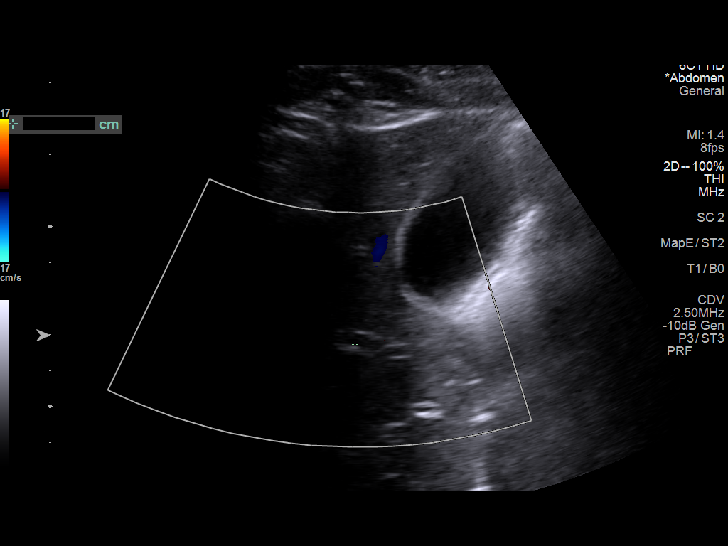
[im 22/57]
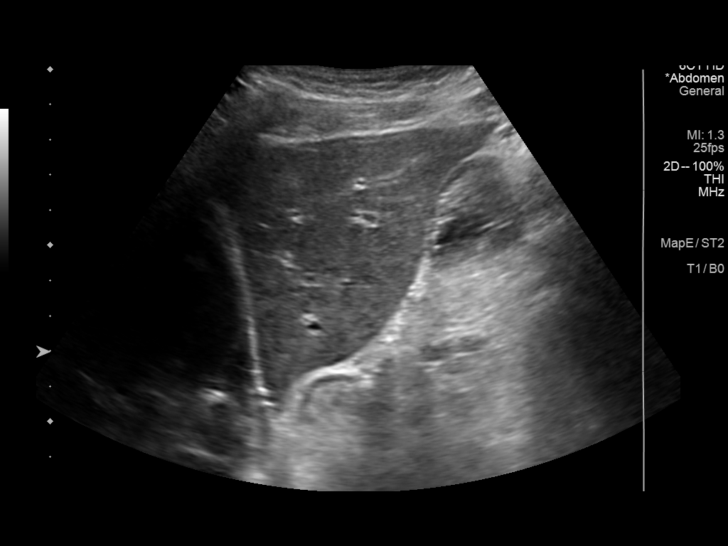
[im 26/57]
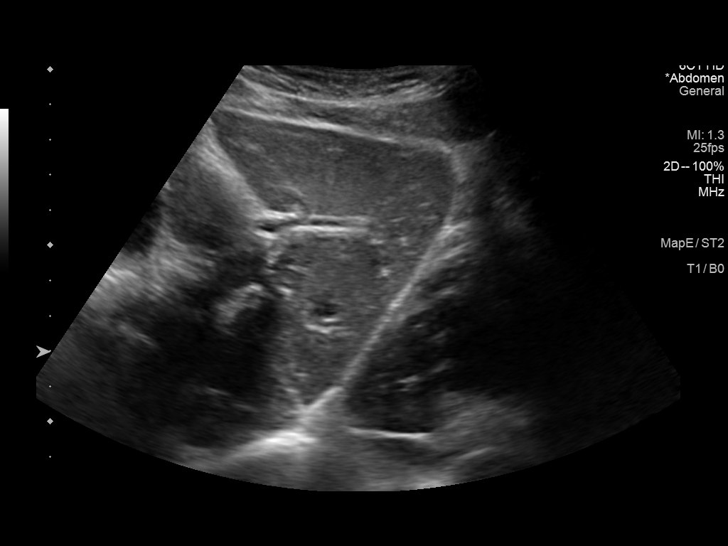
[im 31/57]
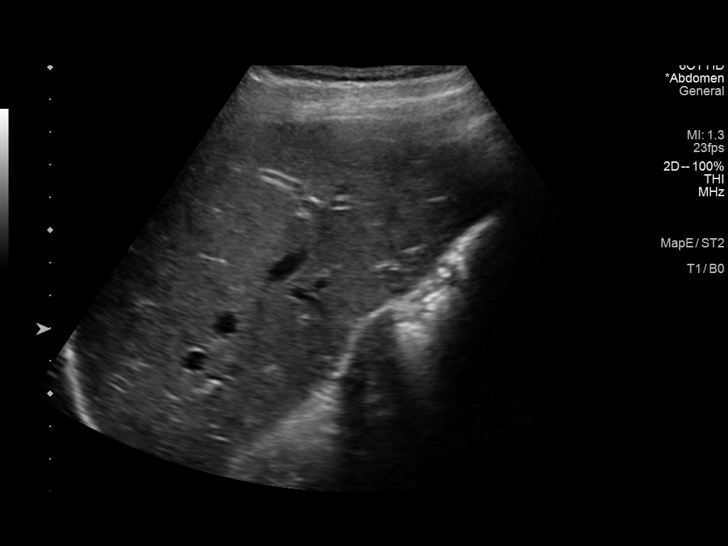
[im 36/57]
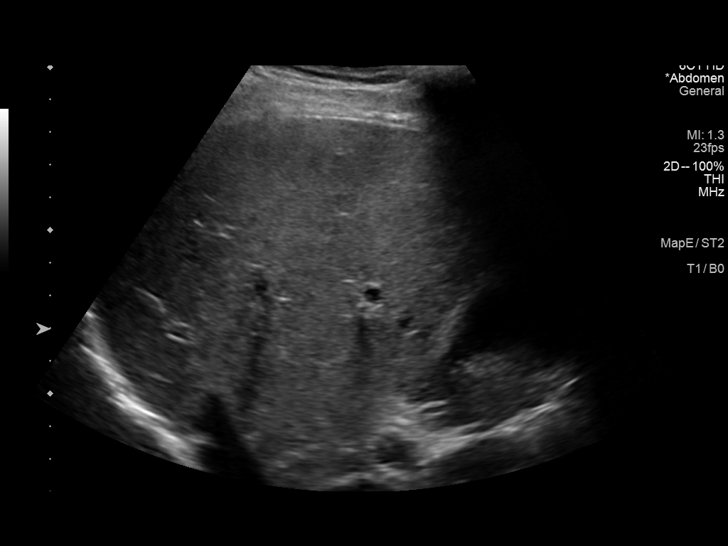
[im 38/57]
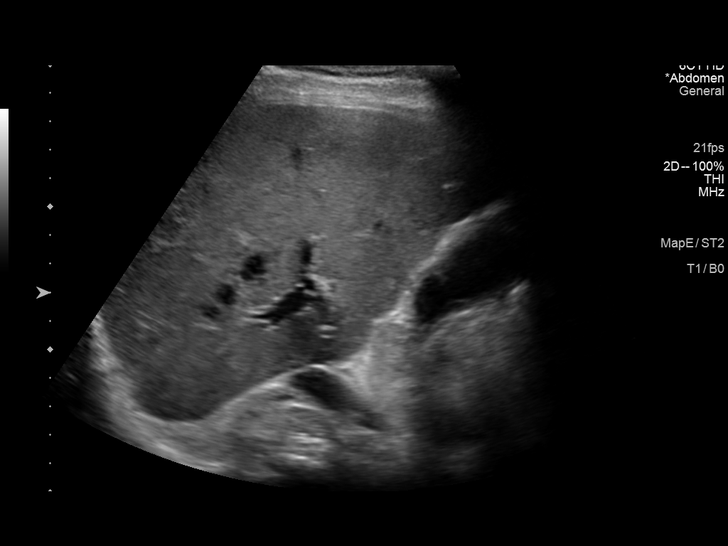
[im 43/57]
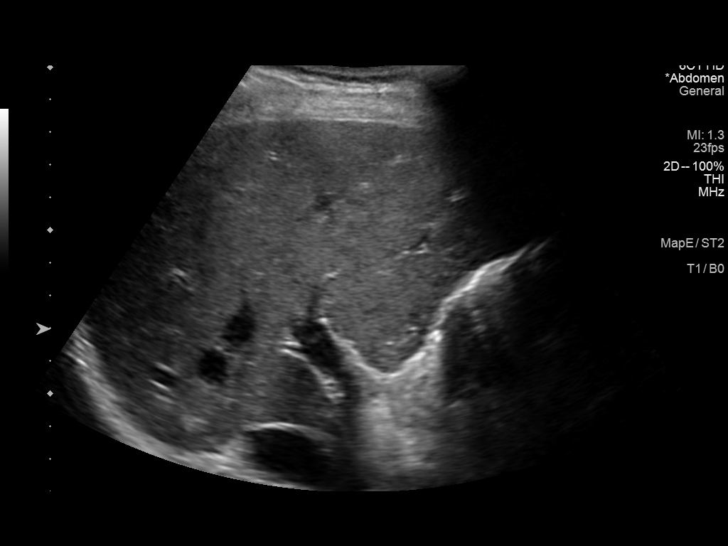
[im 47/57]
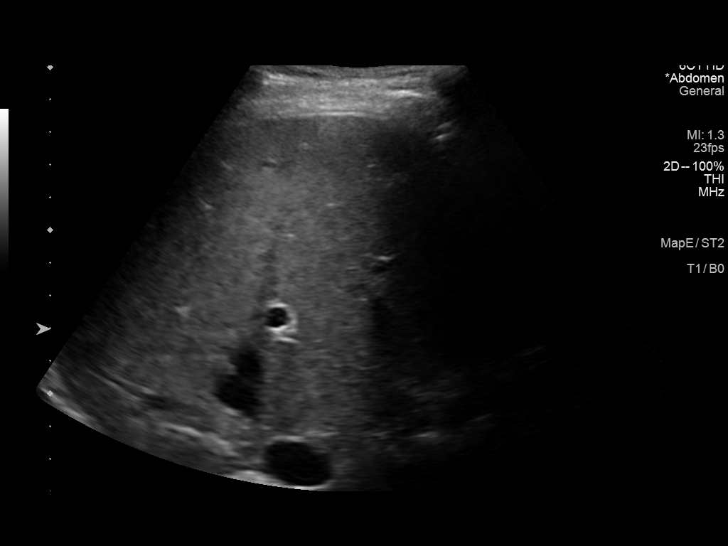
[im 52/57]
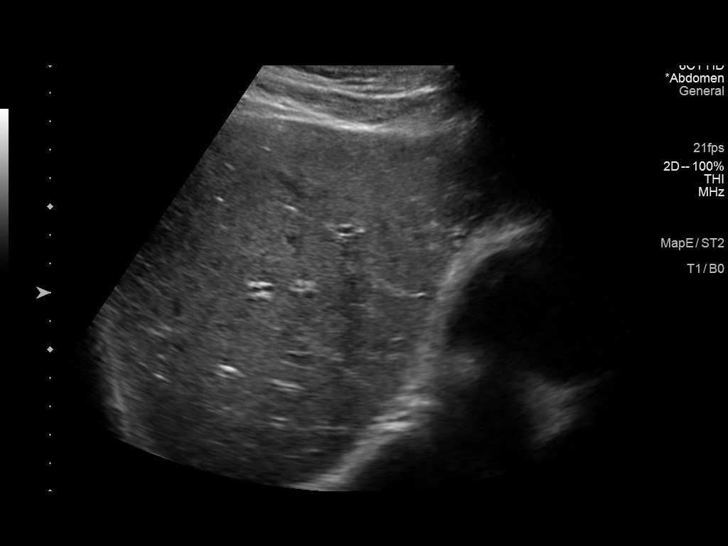
[im 57/57]
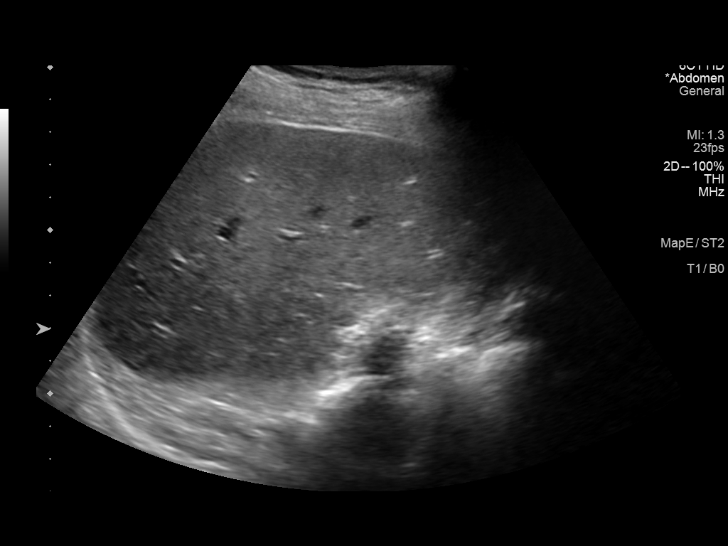

[14 of 25 positions shown; findings below may reference images not displayed]

FINDINGS: Gallbladder:

No gallstones or wall thickening visualized. No sonographic Murphy
sign noted by sonographer.

Common bile duct:

Diameter: 3.5 mm.

Liver:

No focal lesion identified. Within normal limits in parenchymal
echogenicity. Portal vein is patent on color Doppler imaging with
normal direction of blood flow towards the liver.

Other: None.
IMPRESSION: Normal right upper quadrant ultrasound.

## 2021-12-01 DIAGNOSIS — G4733 Obstructive sleep apnea (adult) (pediatric): Secondary | ICD-10-CM | POA: Diagnosis not present

## 2022-01-01 DIAGNOSIS — G4733 Obstructive sleep apnea (adult) (pediatric): Secondary | ICD-10-CM | POA: Diagnosis not present

## 2022-01-06 DIAGNOSIS — G4733 Obstructive sleep apnea (adult) (pediatric): Secondary | ICD-10-CM | POA: Diagnosis not present

## 2022-02-01 DIAGNOSIS — G4733 Obstructive sleep apnea (adult) (pediatric): Secondary | ICD-10-CM | POA: Diagnosis not present

## 2022-02-17 DIAGNOSIS — R7303 Prediabetes: Secondary | ICD-10-CM | POA: Diagnosis not present

## 2022-02-17 DIAGNOSIS — Z23 Encounter for immunization: Secondary | ICD-10-CM | POA: Diagnosis not present

## 2022-02-17 DIAGNOSIS — Z8619 Personal history of other infectious and parasitic diseases: Secondary | ICD-10-CM | POA: Diagnosis not present

## 2022-02-17 DIAGNOSIS — Z1389 Encounter for screening for other disorder: Secondary | ICD-10-CM | POA: Diagnosis not present

## 2022-02-17 DIAGNOSIS — Z13220 Encounter for screening for lipoid disorders: Secondary | ICD-10-CM | POA: Diagnosis not present

## 2022-02-17 DIAGNOSIS — E291 Testicular hypofunction: Secondary | ICD-10-CM | POA: Diagnosis not present

## 2022-02-17 DIAGNOSIS — R030 Elevated blood-pressure reading, without diagnosis of hypertension: Secondary | ICD-10-CM | POA: Diagnosis not present

## 2022-02-17 DIAGNOSIS — G4733 Obstructive sleep apnea (adult) (pediatric): Secondary | ICD-10-CM | POA: Diagnosis not present

## 2022-02-17 DIAGNOSIS — N400 Enlarged prostate without lower urinary tract symptoms: Secondary | ICD-10-CM | POA: Diagnosis not present

## 2022-02-17 DIAGNOSIS — Z125 Encounter for screening for malignant neoplasm of prostate: Secondary | ICD-10-CM | POA: Diagnosis not present

## 2022-02-17 DIAGNOSIS — H9190 Unspecified hearing loss, unspecified ear: Secondary | ICD-10-CM | POA: Diagnosis not present

## 2022-02-17 DIAGNOSIS — Z0000 Encounter for general adult medical examination without abnormal findings: Secondary | ICD-10-CM | POA: Diagnosis not present

## 2022-02-17 DIAGNOSIS — Z136 Encounter for screening for cardiovascular disorders: Secondary | ICD-10-CM | POA: Diagnosis not present

## 2022-03-01 DIAGNOSIS — G4733 Obstructive sleep apnea (adult) (pediatric): Secondary | ICD-10-CM | POA: Diagnosis not present

## 2022-03-03 DIAGNOSIS — E291 Testicular hypofunction: Secondary | ICD-10-CM | POA: Diagnosis not present

## 2022-04-01 DIAGNOSIS — G4733 Obstructive sleep apnea (adult) (pediatric): Secondary | ICD-10-CM | POA: Diagnosis not present

## 2022-04-29 DIAGNOSIS — M19012 Primary osteoarthritis, left shoulder: Secondary | ICD-10-CM | POA: Diagnosis not present

## 2022-04-29 DIAGNOSIS — M7542 Impingement syndrome of left shoulder: Secondary | ICD-10-CM | POA: Diagnosis not present

## 2022-04-29 DIAGNOSIS — M19011 Primary osteoarthritis, right shoulder: Secondary | ICD-10-CM | POA: Diagnosis not present

## 2022-05-01 DIAGNOSIS — G4733 Obstructive sleep apnea (adult) (pediatric): Secondary | ICD-10-CM | POA: Diagnosis not present

## 2022-06-01 DIAGNOSIS — G4733 Obstructive sleep apnea (adult) (pediatric): Secondary | ICD-10-CM | POA: Diagnosis not present

## 2022-09-03 DIAGNOSIS — L814 Other melanin hyperpigmentation: Secondary | ICD-10-CM | POA: Diagnosis not present

## 2022-09-03 DIAGNOSIS — D170 Benign lipomatous neoplasm of skin and subcutaneous tissue of head, face and neck: Secondary | ICD-10-CM | POA: Diagnosis not present

## 2022-09-03 DIAGNOSIS — L578 Other skin changes due to chronic exposure to nonionizing radiation: Secondary | ICD-10-CM | POA: Diagnosis not present

## 2022-09-03 DIAGNOSIS — L821 Other seborrheic keratosis: Secondary | ICD-10-CM | POA: Diagnosis not present

## 2022-09-03 DIAGNOSIS — B351 Tinea unguium: Secondary | ICD-10-CM | POA: Diagnosis not present

## 2022-09-03 DIAGNOSIS — X32XXXS Exposure to sunlight, sequela: Secondary | ICD-10-CM | POA: Diagnosis not present

## 2022-09-03 DIAGNOSIS — D225 Melanocytic nevi of trunk: Secondary | ICD-10-CM | POA: Diagnosis not present

## 2023-01-12 DIAGNOSIS — G4733 Obstructive sleep apnea (adult) (pediatric): Secondary | ICD-10-CM | POA: Diagnosis not present

## 2023-04-02 DIAGNOSIS — G4733 Obstructive sleep apnea (adult) (pediatric): Secondary | ICD-10-CM | POA: Diagnosis not present

## 2023-04-06 DIAGNOSIS — Z13220 Encounter for screening for lipoid disorders: Secondary | ICD-10-CM | POA: Diagnosis not present

## 2023-04-06 DIAGNOSIS — Z0000 Encounter for general adult medical examination without abnormal findings: Secondary | ICD-10-CM | POA: Diagnosis not present

## 2023-04-06 DIAGNOSIS — R202 Paresthesia of skin: Secondary | ICD-10-CM | POA: Diagnosis not present

## 2023-04-06 DIAGNOSIS — H9190 Unspecified hearing loss, unspecified ear: Secondary | ICD-10-CM | POA: Diagnosis not present

## 2023-04-06 DIAGNOSIS — Z9989 Dependence on other enabling machines and devices: Secondary | ICD-10-CM | POA: Diagnosis not present

## 2023-04-06 DIAGNOSIS — G4733 Obstructive sleep apnea (adult) (pediatric): Secondary | ICD-10-CM | POA: Diagnosis not present

## 2023-04-06 DIAGNOSIS — N400 Enlarged prostate without lower urinary tract symptoms: Secondary | ICD-10-CM | POA: Diagnosis not present

## 2023-04-06 DIAGNOSIS — Z1331 Encounter for screening for depression: Secondary | ICD-10-CM | POA: Diagnosis not present

## 2023-04-06 DIAGNOSIS — R030 Elevated blood-pressure reading, without diagnosis of hypertension: Secondary | ICD-10-CM | POA: Diagnosis not present

## 2023-04-06 DIAGNOSIS — R7303 Prediabetes: Secondary | ICD-10-CM | POA: Diagnosis not present

## 2023-04-06 DIAGNOSIS — Z125 Encounter for screening for malignant neoplasm of prostate: Secondary | ICD-10-CM | POA: Diagnosis not present

## 2023-04-06 DIAGNOSIS — Z136 Encounter for screening for cardiovascular disorders: Secondary | ICD-10-CM | POA: Diagnosis not present

## 2023-04-06 DIAGNOSIS — E291 Testicular hypofunction: Secondary | ICD-10-CM | POA: Diagnosis not present

## 2023-04-13 DIAGNOSIS — R200 Anesthesia of skin: Secondary | ICD-10-CM | POA: Diagnosis not present

## 2023-04-28 DIAGNOSIS — G5601 Carpal tunnel syndrome, right upper limb: Secondary | ICD-10-CM | POA: Diagnosis not present

## 2023-05-02 DIAGNOSIS — G4733 Obstructive sleep apnea (adult) (pediatric): Secondary | ICD-10-CM | POA: Diagnosis not present

## 2023-06-02 DIAGNOSIS — G4733 Obstructive sleep apnea (adult) (pediatric): Secondary | ICD-10-CM | POA: Diagnosis not present

## 2023-07-02 DIAGNOSIS — G4733 Obstructive sleep apnea (adult) (pediatric): Secondary | ICD-10-CM | POA: Diagnosis not present

## 2023-09-07 DIAGNOSIS — H6121 Impacted cerumen, right ear: Secondary | ICD-10-CM | POA: Diagnosis not present

## 2023-09-07 DIAGNOSIS — H9313 Tinnitus, bilateral: Secondary | ICD-10-CM | POA: Diagnosis not present

## 2024-06-20 DIAGNOSIS — M720 Palmar fascial fibromatosis [Dupuytren]: Secondary | ICD-10-CM | POA: Diagnosis not present

## 2024-06-20 DIAGNOSIS — Z8619 Personal history of other infectious and parasitic diseases: Secondary | ICD-10-CM | POA: Diagnosis not present

## 2024-06-20 DIAGNOSIS — G4733 Obstructive sleep apnea (adult) (pediatric): Secondary | ICD-10-CM | POA: Diagnosis not present

## 2024-06-20 DIAGNOSIS — N400 Enlarged prostate without lower urinary tract symptoms: Secondary | ICD-10-CM | POA: Diagnosis not present

## 2024-06-20 DIAGNOSIS — Z125 Encounter for screening for malignant neoplasm of prostate: Secondary | ICD-10-CM | POA: Diagnosis not present

## 2024-06-20 DIAGNOSIS — E7800 Pure hypercholesterolemia, unspecified: Secondary | ICD-10-CM | POA: Diagnosis not present

## 2024-06-20 DIAGNOSIS — Z0000 Encounter for general adult medical examination without abnormal findings: Secondary | ICD-10-CM | POA: Diagnosis not present

## 2024-06-20 DIAGNOSIS — H9190 Unspecified hearing loss, unspecified ear: Secondary | ICD-10-CM | POA: Diagnosis not present

## 2024-06-20 DIAGNOSIS — E291 Testicular hypofunction: Secondary | ICD-10-CM | POA: Diagnosis not present

## 2024-06-20 DIAGNOSIS — Z1331 Encounter for screening for depression: Secondary | ICD-10-CM | POA: Diagnosis not present

## 2024-06-20 DIAGNOSIS — R030 Elevated blood-pressure reading, without diagnosis of hypertension: Secondary | ICD-10-CM | POA: Diagnosis not present

## 2024-06-20 DIAGNOSIS — R7303 Prediabetes: Secondary | ICD-10-CM | POA: Diagnosis not present
